# Patient Record
Sex: Female | Born: 1980 | Race: Black or African American | Hispanic: No | Marital: Married | Smoking: Never smoker
Health system: Southern US, Community
[De-identification: ages and names within clinical notes are randomized; demographics above are authoritative.]

## PROBLEM LIST (undated history)

## (undated) DIAGNOSIS — Z98891 History of uterine scar from previous surgery: Secondary | ICD-10-CM

## (undated) DIAGNOSIS — E119 Type 2 diabetes mellitus without complications: Secondary | ICD-10-CM

## (undated) HISTORY — PX: TUBAL LIGATION: SHX77

---
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
## 2002-02-27 ENCOUNTER — Other Ambulatory Visit: Admission: RE | Admit: 2002-02-27 | Discharge: 2002-02-27 | Payer: Self-pay | Admitting: Obstetrics and Gynecology

## 2002-07-15 ENCOUNTER — Inpatient Hospital Stay (HOSPITAL_COMMUNITY): Admission: AD | Admit: 2002-07-15 | Discharge: 2002-07-18 | Payer: Self-pay | Admitting: Obstetrics and Gynecology

## 2002-11-03 ENCOUNTER — Inpatient Hospital Stay (HOSPITAL_COMMUNITY): Admission: AD | Admit: 2002-11-03 | Discharge: 2002-11-03 | Payer: Self-pay | Admitting: Obstetrics and Gynecology

## 2002-12-20 ENCOUNTER — Emergency Department (HOSPITAL_COMMUNITY): Admission: AD | Admit: 2002-12-20 | Discharge: 2002-12-20 | Payer: Self-pay | Admitting: Family Medicine

## 2003-02-02 ENCOUNTER — Inpatient Hospital Stay (HOSPITAL_COMMUNITY): Admission: AD | Admit: 2003-02-02 | Discharge: 2003-02-06 | Payer: Self-pay | Admitting: Obstetrics and Gynecology

## 2003-02-03 ENCOUNTER — Encounter (INDEPENDENT_AMBULATORY_CARE_PROVIDER_SITE_OTHER): Payer: Self-pay | Admitting: *Deleted

## 2004-02-17 ENCOUNTER — Ambulatory Visit (HOSPITAL_BASED_OUTPATIENT_CLINIC_OR_DEPARTMENT_OTHER): Admission: RE | Admit: 2004-02-17 | Discharge: 2004-02-17 | Payer: Self-pay | Admitting: Orthopedic Surgery

## 2004-08-01 ENCOUNTER — Other Ambulatory Visit: Admission: RE | Admit: 2004-08-01 | Discharge: 2004-08-01 | Payer: Self-pay | Admitting: Obstetrics and Gynecology

## 2004-08-29 ENCOUNTER — Emergency Department (HOSPITAL_COMMUNITY): Admission: EM | Admit: 2004-08-29 | Discharge: 2004-08-29 | Payer: Self-pay | Admitting: Emergency Medicine

## 2005-01-20 ENCOUNTER — Inpatient Hospital Stay (HOSPITAL_COMMUNITY): Admission: AD | Admit: 2005-01-20 | Discharge: 2005-01-20 | Payer: Self-pay | Admitting: Obstetrics & Gynecology

## 2005-03-15 ENCOUNTER — Other Ambulatory Visit: Admission: RE | Admit: 2005-03-15 | Discharge: 2005-03-15 | Payer: Self-pay | Admitting: Obstetrics and Gynecology

## 2005-04-23 ENCOUNTER — Ambulatory Visit (HOSPITAL_COMMUNITY): Admission: RE | Admit: 2005-04-23 | Discharge: 2005-04-23 | Payer: Self-pay | Admitting: Obstetrics and Gynecology

## 2005-04-23 ENCOUNTER — Encounter (INDEPENDENT_AMBULATORY_CARE_PROVIDER_SITE_OTHER): Payer: Self-pay | Admitting: *Deleted

## 2006-10-21 ENCOUNTER — Ambulatory Visit (HOSPITAL_COMMUNITY): Admission: RE | Admit: 2006-10-21 | Discharge: 2006-10-21 | Payer: Self-pay | Admitting: Obstetrics and Gynecology

## 2008-09-14 ENCOUNTER — Inpatient Hospital Stay (HOSPITAL_COMMUNITY): Admission: AD | Admit: 2008-09-14 | Discharge: 2008-09-14 | Payer: Self-pay | Admitting: Obstetrics and Gynecology

## 2008-09-16 ENCOUNTER — Inpatient Hospital Stay (HOSPITAL_COMMUNITY): Admission: AD | Admit: 2008-09-16 | Discharge: 2008-09-16 | Payer: Self-pay | Admitting: Obstetrics and Gynecology

## 2009-08-09 ENCOUNTER — Ambulatory Visit: Payer: Self-pay | Admitting: Physician Assistant

## 2009-08-09 ENCOUNTER — Inpatient Hospital Stay (HOSPITAL_COMMUNITY): Admission: AD | Admit: 2009-08-09 | Discharge: 2009-08-10 | Payer: Self-pay | Admitting: Obstetrics and Gynecology

## 2009-08-15 ENCOUNTER — Inpatient Hospital Stay (HOSPITAL_COMMUNITY): Admission: AD | Admit: 2009-08-15 | Discharge: 2009-08-15 | Payer: Self-pay | Admitting: Obstetrics and Gynecology

## 2009-08-15 ENCOUNTER — Ambulatory Visit: Payer: Self-pay | Admitting: Obstetrics and Gynecology

## 2009-08-15 DIAGNOSIS — R109 Unspecified abdominal pain: Secondary | ICD-10-CM

## 2009-08-15 DIAGNOSIS — O00109 Unspecified tubal pregnancy without intrauterine pregnancy: Secondary | ICD-10-CM

## 2009-08-18 ENCOUNTER — Ambulatory Visit (HOSPITAL_COMMUNITY): Admission: RE | Admit: 2009-08-18 | Discharge: 2009-08-18 | Payer: Self-pay | Admitting: Obstetrics and Gynecology

## 2009-08-18 DIAGNOSIS — O00109 Unspecified tubal pregnancy without intrauterine pregnancy: Secondary | ICD-10-CM

## 2009-08-19 ENCOUNTER — Ambulatory Visit: Payer: Self-pay | Admitting: Nurse Practitioner

## 2009-08-19 ENCOUNTER — Inpatient Hospital Stay (HOSPITAL_COMMUNITY): Admission: AD | Admit: 2009-08-19 | Discharge: 2009-08-19 | Payer: Self-pay | Admitting: Obstetrics and Gynecology

## 2009-08-21 ENCOUNTER — Ambulatory Visit (HOSPITAL_COMMUNITY): Admission: RE | Admit: 2009-08-21 | Discharge: 2009-08-21 | Payer: Self-pay | Admitting: Obstetrics and Gynecology

## 2009-08-28 ENCOUNTER — Ambulatory Visit: Payer: Self-pay | Admitting: Nurse Practitioner

## 2009-08-28 ENCOUNTER — Ambulatory Visit (HOSPITAL_COMMUNITY): Admission: RE | Admit: 2009-08-28 | Discharge: 2009-08-28 | Payer: Self-pay | Admitting: Obstetrics & Gynecology

## 2009-11-16 ENCOUNTER — Ambulatory Visit (HOSPITAL_COMMUNITY): Admission: RE | Admit: 2009-11-16 | Discharge: 2009-11-16 | Payer: Self-pay | Admitting: Obstetrics and Gynecology

## 2009-12-06 ENCOUNTER — Encounter
Admission: RE | Admit: 2009-12-06 | Discharge: 2009-12-06 | Payer: Self-pay | Source: Home / Self Care | Attending: Obstetrics and Gynecology | Admitting: Obstetrics and Gynecology

## 2009-12-25 ENCOUNTER — Ambulatory Visit (HOSPITAL_COMMUNITY)
Admission: RE | Admit: 2009-12-25 | Discharge: 2009-12-25 | Payer: Self-pay | Source: Home / Self Care | Admitting: Obstetrics and Gynecology

## 2010-01-24 ENCOUNTER — Ambulatory Visit (HOSPITAL_COMMUNITY)
Admission: RE | Admit: 2010-01-24 | Discharge: 2010-01-24 | Payer: Self-pay | Source: Home / Self Care | Attending: Obstetrics and Gynecology | Admitting: Obstetrics and Gynecology

## 2010-01-30 ENCOUNTER — Inpatient Hospital Stay (HOSPITAL_COMMUNITY)
Admission: AD | Admit: 2010-01-30 | Discharge: 2010-01-30 | Payer: Self-pay | Source: Home / Self Care | Attending: Obstetrics and Gynecology | Admitting: Obstetrics and Gynecology

## 2010-02-05 ENCOUNTER — Ambulatory Visit (HOSPITAL_COMMUNITY)
Admission: RE | Admit: 2010-02-05 | Discharge: 2010-02-05 | Payer: Self-pay | Source: Home / Self Care | Attending: Obstetrics and Gynecology | Admitting: Obstetrics and Gynecology

## 2010-02-06 ENCOUNTER — Ambulatory Visit (HOSPITAL_COMMUNITY)
Admission: RE | Admit: 2010-02-06 | Discharge: 2010-02-06 | Payer: Self-pay | Source: Home / Self Care | Attending: Obstetrics and Gynecology | Admitting: Obstetrics and Gynecology

## 2010-02-13 ENCOUNTER — Inpatient Hospital Stay (HOSPITAL_COMMUNITY)
Admission: AD | Admit: 2010-02-13 | Discharge: 2010-02-13 | Payer: Self-pay | Source: Home / Self Care | Attending: Obstetrics and Gynecology | Admitting: Obstetrics and Gynecology

## 2010-02-14 LAB — CBC
HCT: 34.3 % — ABNORMAL LOW (ref 36.0–46.0)
Hemoglobin: 11.1 g/dL — ABNORMAL LOW (ref 12.0–15.0)
MCH: 27.1 pg (ref 26.0–34.0)
MCHC: 32.4 g/dL (ref 30.0–36.0)
MCV: 83.9 fL (ref 78.0–100.0)
Platelets: 279 10*3/uL (ref 150–400)
RBC: 4.09 MIL/uL (ref 3.87–5.11)
RDW: 14.7 % (ref 11.5–15.5)
WBC: 12.7 10*3/uL — ABNORMAL HIGH (ref 4.0–10.5)

## 2010-02-14 LAB — URIC ACID: Uric Acid, Serum: 3.1 mg/dL (ref 2.4–7.0)

## 2010-02-14 LAB — COMPREHENSIVE METABOLIC PANEL
ALT: 27 U/L (ref 0–35)
AST: 23 U/L (ref 0–37)
Albumin: 2.9 g/dL — ABNORMAL LOW (ref 3.5–5.2)
Alkaline Phosphatase: 91 U/L (ref 39–117)
BUN: 11 mg/dL (ref 6–23)
CO2: 25 mEq/L (ref 19–32)
Calcium: 8.8 mg/dL (ref 8.4–10.5)
Chloride: 104 mEq/L (ref 96–112)
Creatinine, Ser: 0.59 mg/dL (ref 0.4–1.2)
GFR calc Af Amer: >60 mL/min (ref >60)
GFR calc non Af Amer: >60 mL/min (ref >60)
Glucose, Bld: 171 mg/dL — ABNORMAL HIGH (ref 70–99)
Potassium: 3.7 mEq/L (ref 3.5–5.1)
Sodium: 135 mEq/L (ref 135–145)
Total Bilirubin: 0.5 mg/dL (ref 0.3–1.2)
Total Protein: 7 g/dL (ref 6.0–8.3)

## 2010-02-14 LAB — URINALYSIS, ROUTINE W REFLEX MICROSCOPIC
Bilirubin Urine: NEGATIVE
Hgb urine dipstick: NEGATIVE
Ketones, ur: 15 mg/dL — AB
Leukocytes, UA: NEGATIVE
Nitrite: NEGATIVE
Protein, ur: NEGATIVE mg/dL
Specific Gravity, Urine: >1.03 — ABNORMAL HIGH (ref 1.005–1.030)
Urine Glucose, Fasting: >1000 mg/dL — AB
Urobilinogen, UA: 0.2 mg/dL (ref 0.0–1.0)
pH: 6 (ref 5.0–8.0)

## 2010-02-14 LAB — URINE MICROSCOPIC-ADD ON

## 2010-02-14 LAB — LACTATE DEHYDROGENASE: LDH: 155 U/L (ref 94–250)

## 2010-02-19 ENCOUNTER — Ambulatory Visit (HOSPITAL_COMMUNITY)
Admission: RE | Admit: 2010-02-19 | Discharge: 2010-02-19 | Payer: Self-pay | Source: Home / Self Care | Attending: Obstetrics and Gynecology | Admitting: Obstetrics and Gynecology

## 2010-04-09 LAB — CBC
HCT: 34.7 % — ABNORMAL LOW (ref 36.0–46.0)
Hemoglobin: 11.3 g/dL — ABNORMAL LOW (ref 12.0–15.0)
MCH: 27.2 pg (ref 26.0–34.0)
MCHC: 32.6 g/dL (ref 30.0–36.0)
MCV: 83.4 fL (ref 78.0–100.0)
Platelets: 280 10*3/uL (ref 150–400)
RBC: 4.16 MIL/uL (ref 3.87–5.11)
RDW: 14.9 % (ref 11.5–15.5)
WBC: 11.4 10*3/uL — ABNORMAL HIGH (ref 4.0–10.5)

## 2010-04-09 LAB — COMPREHENSIVE METABOLIC PANEL
ALT: 27 U/L (ref 0–35)
AST: 20 U/L (ref 0–37)
Albumin: 3 g/dL — ABNORMAL LOW (ref 3.5–5.2)
Alkaline Phosphatase: 85 U/L (ref 39–117)
BUN: 9 mg/dL (ref 6–23)
CO2: 25 mEq/L (ref 19–32)
Calcium: 8.4 mg/dL (ref 8.4–10.5)
Chloride: 101 mEq/L (ref 96–112)
Creatinine, Ser: 0.56 mg/dL (ref 0.4–1.2)
GFR calc Af Amer: >60 mL/min (ref >60)
GFR calc non Af Amer: >60 mL/min (ref >60)
Glucose, Bld: 170 mg/dL — ABNORMAL HIGH (ref 70–99)
Potassium: 3.7 mEq/L (ref 3.5–5.1)
Sodium: 132 mEq/L — ABNORMAL LOW (ref 135–145)
Total Bilirubin: 0.5 mg/dL (ref 0.3–1.2)
Total Protein: 7.1 g/dL (ref 6.0–8.3)

## 2010-04-12 ENCOUNTER — Inpatient Hospital Stay (HOSPITAL_COMMUNITY)
Admission: AD | Admit: 2010-04-12 | Discharge: 2010-04-13 | Disposition: A | Discharge status: Home / Self Care | Payer: 59 | Source: Ambulatory Visit | Attending: Obstetrics and Gynecology | Admitting: Obstetrics and Gynecology

## 2010-04-12 DIAGNOSIS — O139 Gestational [pregnancy-induced] hypertension without significant proteinuria, unspecified trimester: Secondary | ICD-10-CM

## 2010-04-12 DIAGNOSIS — R109 Unspecified abdominal pain: Secondary | ICD-10-CM | POA: Insufficient documentation

## 2010-04-12 LAB — URINE MICROSCOPIC-ADD ON

## 2010-04-12 LAB — URINALYSIS, ROUTINE W REFLEX MICROSCOPIC
Bilirubin Urine: NEGATIVE
Glucose, UA: 100 mg/dL — AB
Ketones, ur: NEGATIVE mg/dL
Leukocytes, UA: NEGATIVE
Nitrite: NEGATIVE
Protein, ur: 30 mg/dL — AB
Specific Gravity, Urine: >1.03 — ABNORMAL HIGH (ref 1.005–1.030)
Urobilinogen, UA: 0.2 mg/dL (ref 0.0–1.0)
pH: 5.5 (ref 5.0–8.0)

## 2010-04-12 LAB — CBC
HCT: 31.5 % — ABNORMAL LOW (ref 36.0–46.0)
Hemoglobin: 9.9 g/dL — ABNORMAL LOW (ref 12.0–15.0)
MCH: 25.8 pg — ABNORMAL LOW (ref 26.0–34.0)
MCHC: 31.4 g/dL (ref 30.0–36.0)
MCV: 82.2 fL (ref 78.0–100.0)
Platelets: 325 10*3/uL (ref 150–400)
RBC: 3.83 MIL/uL — ABNORMAL LOW (ref 3.87–5.11)
RDW: 14.5 % (ref 11.5–15.5)
WBC: 12.9 10*3/uL — ABNORMAL HIGH (ref 4.0–10.5)

## 2010-04-12 LAB — LACTATE DEHYDROGENASE: LDH: 170 U/L (ref 94–250)

## 2010-04-12 LAB — COMPREHENSIVE METABOLIC PANEL
ALT: 22 U/L (ref 0–35)
Alkaline Phosphatase: 108 U/L (ref 39–117)
BUN: 9 mg/dL (ref 6–23)
CO2: 24 mEq/L (ref 19–32)
GFR calc non Af Amer: >60 mL/min (ref >60)
Glucose, Bld: 218 mg/dL — ABNORMAL HIGH (ref 70–99)
Potassium: 3.8 mEq/L (ref 3.5–5.1)
Sodium: 133 mEq/L — ABNORMAL LOW (ref 135–145)
Total Protein: 6.8 g/dL (ref 6.0–8.3)

## 2010-04-12 LAB — URIC ACID: Uric Acid, Serum: 2.9 mg/dL (ref 2.4–7.0)

## 2010-04-14 LAB — URINE MICROSCOPIC-ADD ON

## 2010-04-14 LAB — CBC
Hemoglobin: 11.8 g/dL — ABNORMAL LOW (ref 12.0–15.0)
MCH: 28.3 pg (ref 26.0–34.0)
MCHC: 33.5 g/dL (ref 30.0–36.0)
MCV: 84.8 fL (ref 78.0–100.0)
Platelets: 322 10*3/uL (ref 150–400)
RBC: 4.17 MIL/uL (ref 3.87–5.11)
RDW: 14.8 % (ref 11.5–15.5)
RDW: 15.1 % (ref 11.5–15.5)
WBC: 8.4 10*3/uL (ref 4.0–10.5)

## 2010-04-14 LAB — CREATININE, SERUM
Creatinine, Ser: 0.76 mg/dL (ref 0.4–1.2)
GFR calc Af Amer: >60 mL/min (ref >60)
GFR calc non Af Amer: >60 mL/min (ref >60)

## 2010-04-14 LAB — AST: AST: 23 U/L (ref 0–37)

## 2010-04-14 LAB — HCG, QUANTITATIVE, PREGNANCY
hCG, Beta Chain, Quant, S: 105 m[IU]/mL — ABNORMAL HIGH (ref <5)
hCG, Beta Chain, Quant, S: 126 m[IU]/mL — ABNORMAL HIGH (ref <5)
hCG, Beta Chain, Quant, S: 84 m[IU]/mL — ABNORMAL HIGH (ref <5)

## 2010-04-14 LAB — DIFFERENTIAL
Basophils Relative: 0 % (ref 0–1)
Eosinophils Absolute: 0.1 10*3/uL (ref 0.0–0.7)
Eosinophils Relative: 1 % (ref 0–5)
Lymphs Abs: 2 10*3/uL (ref 0.7–4.0)

## 2010-04-14 LAB — BUN: BUN: 13 mg/dL (ref 6–23)

## 2010-04-14 LAB — URINALYSIS, ROUTINE W REFLEX MICROSCOPIC
Bilirubin Urine: NEGATIVE
Glucose, UA: NEGATIVE mg/dL
Ketones, ur: NEGATIVE mg/dL
Protein, ur: NEGATIVE mg/dL
Urobilinogen, UA: 1 mg/dL (ref 0.0–1.0)

## 2010-04-15 LAB — WET PREP, GENITAL
Trich, Wet Prep: NONE SEEN
Yeast Wet Prep HPF POC: NONE SEEN

## 2010-04-15 LAB — CBC
Platelets: 306 10*3/uL (ref 150–400)
RBC: 4.1 MIL/uL (ref 3.87–5.11)
WBC: 7.4 10*3/uL (ref 4.0–10.5)

## 2010-04-15 LAB — ABO/RH: ABO/RH(D): A POS

## 2010-04-15 LAB — URINALYSIS, ROUTINE W REFLEX MICROSCOPIC
Bilirubin Urine: NEGATIVE
Glucose, UA: NEGATIVE mg/dL
Ketones, ur: NEGATIVE mg/dL
Nitrite: NEGATIVE
pH: 6 (ref 5.0–8.0)

## 2010-04-15 LAB — URINE MICROSCOPIC-ADD ON

## 2010-04-15 LAB — HCG, QUANTITATIVE, PREGNANCY: hCG, Beta Chain, Quant, S: 137 m[IU]/mL — ABNORMAL HIGH (ref <5)

## 2010-04-16 ENCOUNTER — Encounter: Payer: 59 | Attending: Obstetrics and Gynecology | Admitting: Dietician

## 2010-04-16 DIAGNOSIS — Z713 Dietary counseling and surveillance: Secondary | ICD-10-CM | POA: Insufficient documentation

## 2010-04-16 DIAGNOSIS — O99810 Abnormal glucose complicating pregnancy: Secondary | ICD-10-CM | POA: Insufficient documentation

## 2010-04-18 ENCOUNTER — Inpatient Hospital Stay (HOSPITAL_COMMUNITY)
Admission: AD | Admit: 2010-04-18 | Discharge: 2010-04-18 | Disposition: A | Discharge status: Home / Self Care | Payer: 59 | Source: Ambulatory Visit | Attending: Obstetrics & Gynecology | Admitting: Obstetrics & Gynecology

## 2010-04-18 DIAGNOSIS — R109 Unspecified abdominal pain: Secondary | ICD-10-CM | POA: Insufficient documentation

## 2010-04-18 DIAGNOSIS — M545 Low back pain, unspecified: Secondary | ICD-10-CM | POA: Insufficient documentation

## 2010-04-18 DIAGNOSIS — O99891 Other specified diseases and conditions complicating pregnancy: Secondary | ICD-10-CM | POA: Insufficient documentation

## 2010-04-18 LAB — URINALYSIS, ROUTINE W REFLEX MICROSCOPIC
Glucose, UA: 500 mg/dL — AB
Hgb urine dipstick: NEGATIVE
Ketones, ur: 15 mg/dL — AB
Protein, ur: 30 mg/dL — AB

## 2010-04-18 LAB — URINE MICROSCOPIC-ADD ON

## 2010-04-20 ENCOUNTER — Ambulatory Visit: Payer: Self-pay | Admitting: Dietician

## 2010-05-05 LAB — WET PREP, GENITAL
Trich, Wet Prep: NONE SEEN
Yeast Wet Prep HPF POC: NONE SEEN

## 2010-05-05 LAB — URINALYSIS, DIPSTICK ONLY
Bilirubin Urine: NEGATIVE
Glucose, UA: NEGATIVE mg/dL
Ketones, ur: 15 mg/dL — AB
Leukocytes, UA: NEGATIVE
pH: 6 (ref 5.0–8.0)

## 2010-05-05 LAB — CBC
Platelets: 294 10*3/uL (ref 150–400)
WBC: 7.3 10*3/uL (ref 4.0–10.5)

## 2010-05-05 LAB — GC/CHLAMYDIA PROBE AMP, GENITAL: GC Probe Amp, Genital: NEGATIVE

## 2010-05-16 ENCOUNTER — Other Ambulatory Visit (HOSPITAL_COMMUNITY): Payer: Self-pay | Admitting: Obstetrics and Gynecology

## 2010-05-16 DIAGNOSIS — O2690 Pregnancy related conditions, unspecified, unspecified trimester: Secondary | ICD-10-CM

## 2010-05-18 ENCOUNTER — Ambulatory Visit (HOSPITAL_COMMUNITY)
Admission: RE | Admit: 2010-05-18 | Discharge: 2010-05-18 | Disposition: A | Discharge status: Home / Self Care | Payer: 59 | Source: Ambulatory Visit | Attending: Obstetrics and Gynecology | Admitting: Obstetrics and Gynecology

## 2010-05-18 DIAGNOSIS — O2690 Pregnancy related conditions, unspecified, unspecified trimester: Secondary | ICD-10-CM

## 2010-05-18 DIAGNOSIS — O10019 Pre-existing essential hypertension complicating pregnancy, unspecified trimester: Secondary | ICD-10-CM | POA: Insufficient documentation

## 2010-05-18 DIAGNOSIS — O34219 Maternal care for unspecified type scar from previous cesarean delivery: Secondary | ICD-10-CM | POA: Insufficient documentation

## 2010-05-18 DIAGNOSIS — O99810 Abnormal glucose complicating pregnancy: Secondary | ICD-10-CM | POA: Insufficient documentation

## 2010-05-19 ENCOUNTER — Inpatient Hospital Stay (HOSPITAL_COMMUNITY)
Admission: AD | Admit: 2010-05-19 | Discharge: 2010-05-20 | Disposition: A | Discharge status: Home / Self Care | Payer: 59 | Source: Ambulatory Visit | Attending: Obstetrics and Gynecology | Admitting: Obstetrics and Gynecology

## 2010-05-19 DIAGNOSIS — O10019 Pre-existing essential hypertension complicating pregnancy, unspecified trimester: Secondary | ICD-10-CM | POA: Insufficient documentation

## 2010-05-20 LAB — COMPREHENSIVE METABOLIC PANEL
ALT: 22 U/L (ref 0–35)
AST: 20 U/L (ref 0–37)
Alkaline Phosphatase: 123 U/L — ABNORMAL HIGH (ref 39–117)
CO2: 26 mEq/L (ref 19–32)
Chloride: 101 mEq/L (ref 96–112)
Creatinine, Ser: 0.71 mg/dL (ref 0.4–1.2)
GFR calc Af Amer: >60 mL/min (ref >60)
GFR calc non Af Amer: >60 mL/min (ref >60)
Potassium: 3.9 mEq/L (ref 3.5–5.1)
Total Bilirubin: 0.4 mg/dL (ref 0.3–1.2)

## 2010-05-20 LAB — CBC
Hemoglobin: 9.7 g/dL — ABNORMAL LOW (ref 12.0–15.0)
MCH: 24.4 pg — ABNORMAL LOW (ref 26.0–34.0)
RBC: 3.98 MIL/uL (ref 3.87–5.11)

## 2010-05-31 ENCOUNTER — Encounter (HOSPITAL_COMMUNITY): Payer: Self-pay | Admitting: *Deleted

## 2010-05-31 ENCOUNTER — Inpatient Hospital Stay (HOSPITAL_COMMUNITY)
Admission: RE | Admit: 2010-05-31 | Discharge: 2010-06-05 | DRG: 765 | Disposition: A | Discharge status: Home / Self Care | Payer: 59 | Source: Ambulatory Visit | Attending: Obstetrics and Gynecology | Admitting: Obstetrics and Gynecology

## 2010-05-31 DIAGNOSIS — Z302 Encounter for sterilization: Secondary | ICD-10-CM

## 2010-05-31 DIAGNOSIS — IMO0002 Reserved for concepts with insufficient information to code with codable children: Secondary | ICD-10-CM | POA: Diagnosis present

## 2010-05-31 DIAGNOSIS — O99814 Abnormal glucose complicating childbirth: Principal | ICD-10-CM | POA: Diagnosis present

## 2010-05-31 DIAGNOSIS — O3660X0 Maternal care for excessive fetal growth, unspecified trimester, not applicable or unspecified: Secondary | ICD-10-CM | POA: Diagnosis present

## 2010-05-31 DIAGNOSIS — I10 Essential (primary) hypertension: Secondary | ICD-10-CM | POA: Diagnosis present

## 2010-05-31 LAB — COMPREHENSIVE METABOLIC PANEL
BUN: 10 mg/dL (ref 6–23)
CO2: 24 mEq/L (ref 19–32)
Calcium: 9.1 mg/dL (ref 8.4–10.5)
Creatinine, Ser: 0.72 mg/dL (ref 0.4–1.2)
GFR calc Af Amer: >60 mL/min (ref >60)
GFR calc non Af Amer: >60 mL/min (ref >60)
Glucose, Bld: 197 mg/dL — ABNORMAL HIGH (ref 70–99)
Total Bilirubin: 0.4 mg/dL (ref 0.3–1.2)

## 2010-05-31 LAB — CBC
Platelets: 345 10*3/uL (ref 150–400)
RBC: 3.98 MIL/uL (ref 3.87–5.11)
RDW: 15.3 % (ref 11.5–15.5)
WBC: 8.9 10*3/uL (ref 4.0–10.5)

## 2010-05-31 LAB — URIC ACID: Uric Acid, Serum: 4.6 mg/dL (ref 2.4–7.0)

## 2010-05-31 LAB — LACTATE DEHYDROGENASE: LDH: 270 U/L — ABNORMAL HIGH (ref 94–250)

## 2010-06-01 ENCOUNTER — Other Ambulatory Visit: Payer: Self-pay | Admitting: Obstetrics and Gynecology

## 2010-06-01 LAB — GLUCOSE, CAPILLARY
Glucose-Capillary: 211 mg/dL — ABNORMAL HIGH (ref 70–99)
Glucose-Capillary: 228 mg/dL — ABNORMAL HIGH (ref 70–99)
Glucose-Capillary: 146 mg/dL — ABNORMAL HIGH (ref 70–99)
Glucose-Capillary: 153 mg/dL — ABNORMAL HIGH (ref 70–99)
Glucose-Capillary: 144 mg/dL — ABNORMAL HIGH (ref 70–99)

## 2010-06-01 LAB — RPR: RPR Ser Ql: NONREACTIVE

## 2010-06-01 LAB — SURGICAL PCR SCREEN: MRSA, PCR: NEGATIVE

## 2010-06-02 LAB — CBC
HCT: 31 % — ABNORMAL LOW (ref 36.0–46.0)
Hemoglobin: 9.5 g/dL — ABNORMAL LOW (ref 12.0–15.0)
MCH: 24 pg — ABNORMAL LOW (ref 26.0–34.0)
MCHC: 30.6 g/dL (ref 30.0–36.0)

## 2010-06-02 LAB — GLUCOSE, CAPILLARY
Glucose-Capillary: 75 mg/dL (ref 70–99)
Glucose-Capillary: 108 mg/dL — ABNORMAL HIGH (ref 70–99)
Glucose-Capillary: 102 mg/dL — ABNORMAL HIGH (ref 70–99)

## 2010-06-03 LAB — GLUCOSE, CAPILLARY
Glucose-Capillary: 158 mg/dL — ABNORMAL HIGH (ref 70–99)
Glucose-Capillary: 75 mg/dL (ref 70–99)
Glucose-Capillary: 54 mg/dL — ABNORMAL LOW (ref 70–99)
Glucose-Capillary: 65 mg/dL — ABNORMAL LOW (ref 70–99)
Glucose-Capillary: 66 mg/dL — ABNORMAL LOW (ref 70–99)
Glucose-Capillary: 66 mg/dL — ABNORMAL LOW (ref 70–99)
Glucose-Capillary: 172 mg/dL — ABNORMAL HIGH (ref 70–99)

## 2010-06-03 LAB — CREATININE, SERUM: GFR calc non Af Amer: >60 mL/min (ref >60)

## 2010-06-04 LAB — GLUCOSE, CAPILLARY
Glucose-Capillary: 111 mg/dL — ABNORMAL HIGH (ref 70–99)
Glucose-Capillary: 79 mg/dL (ref 70–99)
Glucose-Capillary: 125 mg/dL — ABNORMAL HIGH (ref 70–99)

## 2010-06-05 LAB — GLUCOSE, CAPILLARY
Glucose-Capillary: 76 mg/dL (ref 70–99)
Glucose-Capillary: 103 mg/dL — ABNORMAL HIGH (ref 70–99)
Glucose-Capillary: 118 mg/dL — ABNORMAL HIGH (ref 70–99)
Glucose-Capillary: 109 mg/dL — ABNORMAL HIGH (ref 70–99)

## 2010-06-13 NOTE — Op Note (Signed)
Nicole Cherry, Nicole Cherry               ACCOUNT NO.:  [id number]  MEDICAL RECORD NO.:  [id number]           PATIENT TYPE:  I  LOCATION:  9374                          FACILITY:  [facility]  PHYSICIAN:  Carrington Clamp, M.D. DATE OF BIRTH:  Apr 15, 1980  DATE OF PROCEDURE:  06/01/2010 DATE OF DISCHARGE:                              OPERATIVE REPORT   PREOPERATIVE DIAGNOSES: 1. A 35-week intrauterine pregnancy. 2. Uncontrolled A2 gestational diabetes. 3. Chronic hypertension. 4. Superimposed severe preeclampsia. 5. Multiparous, desires permit sterility.  POSTOPERATIVE DIAGNOSIS: 1. A 35-week intrauterine pregnancy. 2. Uncontrolled A2 gestational diabetes. 3. Chronic hypertension. 4. Superimposed severe preeclampsia. 5. Multiparous, desires permit sterility. 6. Macrosomia.  PROCEDURE:  Repeat low-transverse cesarean section and bilateral tubal ligation.  SURGEON:  Carrington Clamp, MD  ASSISTANT:  Luvenia Redden, MD  ANESTHESIA:  Spinal.  FINDINGS:  Female infant, vertex presentation, 8 pounds 12 ounces, Apgar's of 8 and 9.  Normal tubes, ovaries, and uterus were seen.  SPECIMEN:  Placenta to pathology as well as right and left tubal segments to pathology.  ESTIMATED BLOOD LOSS:  1000 mL.  IV FLUIDS:  1125 mL.  Urine output was 150 mL.  COMPLICATIONS:  None.  MEDICATIONS:  Ancef, Pitocin, and Glucommander with insulin.  COUNTS:  Correct x3.  REASON FOR OPERATION:  Nicole Cherry is a 30year-old G7, P1-0-5-1 who has had a complicated prenatal course including uncontrolled gestational diabetes despite our best efforts and chronic hypertension. Recently her blood pressures have been spiking to 170s/100s, although they would come down afterwards with watching them after bedrest.  The patient's labetalol had recently been increased a t.i.d.  She had recently had a 24-hour urine that showed 312 mg of protein.  She recently had an ultrasound that showed the baby was vertex  presentation, weighing approximately 8 pounds.  The patient had been seen by [facility] and I called Dr. Tona Sensing for a phone consult on May 31, 2010.  I discussed with him the proteinuria, headache the fact that the patient was having intermittent headaches and also the worsening blood pressures and Dr. Tona Sensing felt strongly that this consisted of severe superimposed preeclampsia and recommended an expedited delivery.  The patient was then brought in for glucose control overnight on May 31, 2010.  Her fasting blood glucose that morning was 101 followed by another one at 2 hours later of 115.  The patient had been receiving an insulin drip with the Glucommander throughout the night.  The neonatal team was informed of her status this morning.  All risks, benefits, and alternatives have been discussed with the patient and the patient agreed to proceed.  The patient was scheduled for a C- section at 8 o'clock this morning.  TECHNIQUE:  The patient was taken to the operating room this morning and after adequate spinal anesthesia was achieved, the patient was prepped and draped in usual sterile fashion in dorsal supine position with leftward tilt.  A Pfannenstiel skin incision was made with the scalpel carried down to the fascia with Bovie cautery.  The fascia was incised in midline with the  scalpel and carried in transverse curvilinear manner with Mayo scissors.  Hemostasis was achieved with Bovie cautery careful to try and cauterize as many cut vessels as possible.  The fascia was reflected superior inferior from rectus muscles with both sharp and blunt dissection with the Mayo scissors and the Bovie cautery.  A bowel free portion of the peritoneum was then carefully entered into with the scalpel.  The Mayo scissors were then used to carefully separate the rectus muscles and extend the incision.  The peritoneum was extended with good visualization of bowel and the  bladder.  The Alexis instrument was then placed and vesicouterine fascia tented up and incised in a transverse curvilinear manner.  The bladder was retracted inferiorly and the 2 cm incision was made in the upper portion of the lower uterine segment until the amnion was opened.  Clear fluid was noted.  The baby was delivered with the vacuum extractor without complication.  There were no pop offs.  The baby was bulb suctioned. The cord was clamped and cut and baby was handed to awaiting neonatal team.  The placenta was then delivered manually after cord bloods had been obtained.  The uterus was cleared of all debris.  The uterine incision was then closed with a running lock stitch of 0 Monocryl.  An imbricating layer of 0 Monocryl was also used.  Four additional figure- of-eight stitches were then used to help achieve hemostasis.  Once hemostasis was achieved, attention was turned to the tubes.  Avascular portion of mesosalpinx was entered into on the left-hand side and extended for segment of about 4 cm.  Two 0 Vicryl freehand ties were then tied to create intervening segment of approximately 4 cm of the tube.  The tube was then excised and sent to pathology.  On the right- hand side, the same procedure was performed, however, an avascular portion was able to gotten for the entire 4 cm, so a hemostat was used to cauterize the mesosalpinx vessel and this was achieved without complication.  There was no bleeding.  The segment on the right-hand side was then secured with 2 freehand ties on each side of 0 plain gut. The tube was then excised with the Metzenbaum scissors and sent to pathology.  The mesosalpinx was inspected and found to be hemostatic. The tubes were then returned to the abdomen and the uterine incision reinspected and found to be hemostatic.  The Alexis instrument was then removed and the peritoneum closed with a running stitch of 2-0 Vicryl. This incorporated the rectus  muscles as separate layer.  The fascia was then closed with a running stitch of 0 Vicryl.  The subcutaneous tissues rendered hemostatic with Bovie cautery irrigation.  The subcutaneous tissue was then closed with interrupted stitches of 2-0 plain gut.  The skin was closed with staples.  The patient tolerated the procedure well and was returned to recovery in stable condition.     Carrington Clamp, M.D.     MH/MEDQ  D:  06/01/2010  T:  06/01/2010  Job:  [id number]  Electronically Signed by Carrington Clamp MD on 06/13/2010 03:02:33 PM

## 2010-06-15 NOTE — Discharge Summary (Signed)
   NAMEBRECK, HOLLINGER                           ACCOUNT NO.:  [id number]   MEDICAL RECORD NO.:  [id number]                   PATIENT TYPE:  INP   LOCATION:  9307                                 FACILITY:  [facility]   PHYSICIAN:  Malva Limes, M.D.                 DATE OF BIRTH:  1980/04/19   DATE OF ADMISSION:  07/15/2002  DATE OF DISCHARGE:  07/18/2002                                 DISCHARGE SUMMARY   FINAL DIAGNOSES:  1. Intrauterine pregnancy at six weeks gestation.  2. Pyelonephritis.   HOSPITAL COURSE:  This 30 year old G2 P0 presents at six weeks gestation  with right CVA tenderness and a temperature.  The patient was also having  some nausea and vomiting.  She had a temperature of 101.1 upon admission.  She was admitted at this time and her urinalysis showed 20-30 white blood  cell count per field.  She was admitted at this time for IV antibiotics.  She had had an ultrasound performed two days ago which did confirm an  intrauterine pregnancy and gave her dates.  By hospital day #1 the patient  was feeling much better and the maximum of her temperature was 100.4.  The  patient was rechecked again 24 hours later.  She continued to be afebrile  and her pain was gone.  She was felt ready for discharge.  She was sent home  on a regular diet, told to continue her antibiotic with Keflex 500 mg one  q.i.d. for five more days.  She was to follow back up in the office in a  week, and to call if any more fevers or pain.     [name], P.A.-C.                Malva Limes, M.D.    MB/MEDQ  D:  08/30/2002  T:  08/31/2002  Job:  [id number]

## 2010-06-15 NOTE — Op Note (Signed)
Nicole Cherry, Nicole Cherry               ACCOUNT NO.:  [id number]   MEDICAL RECORD NO.:  [id number]          PATIENT TYPE:  AMB   LOCATION:  SDC                           [facility]   PHYSICIAN:  Carrington Clamp, M.D. DATE OF BIRTH:  08/04/1980   DATE OF PROCEDURE:  04/23/2005  DATE OF DISCHARGE:                                 OPERATIVE REPORT   PREOPERATIVE DIAGNOSIS:  Missed AB.   POSTOPERATIVE DIAGNOSIS:  Missed AB.   PROCEDURES:  Dilation and evacuation, less than 12 weeks.   SURGEON:  Carrington Clamp, M.D.   ASSISTANT:  None.   ANESTHESIA:  General LMA   SPECIMENS:  Uterine contents.   ESTIMATED BLOOD LOSS:  200 mL.   IV FLUIDS:  500 mL.   URINE OUTPUT:  Not measured.   COMPLICATIONS:  Were none.   FINDINGS:  8 weeks' size uterus down to 7-week size post procedure. Good  CRIE.  Counts were correct x3.  Medications were Methergine 0.2 mg IM.   TECHNIQUE:  After adequate LMA anesthesia was achieved, the patient prepped,  draped in usual sterile fashion in dorsal lithotomy position.  Bladder was  drained with a rubber catheter and speculum placed in the vagina.  The  cervix was grasped with the single-tooth tenaculum and the cervix was  dilated up with Shawnie Pons dilators.  A 9 mm curette was passed into the uterine  cavity and suction curettage was performed.  Alternating suction and sharp  curettage was used until good CRIE was obtained. Methergine 0.2 mg IM was  given. Bleeding was slightly more than usual but not more than anticipated.  All instruments were then withdrawn from the vagina and the patient  tolerated the procedure well.  She returns to recovery room in stable  condition.     Carrington Clamp, M.D.  Electronically Signed    MH/MEDQ  D:  04/23/2005  T:  04/24/2005  Job:  [id number]

## 2010-06-15 NOTE — Discharge Summary (Signed)
Nicole Cherry, DELROSARIO                           ACCOUNT NO.:  [id number]   MEDICAL RECORD NO.:  [id number]                   PATIENT TYPE:  INP   LOCATION:  9120                                 [facility]ACILITY:  [facility]   PHYSICIAN:  Ilda Mori, M.D.                DATE OF BIRTH:  [birth year]   DATE OF ADMISSION:  02/02/2003  DATE OF DISCHARGE:  02/06/2003                                 DISCHARGE SUMMARY   FINAL DIAGNOSES:  1. Intrauterine pregnancy at [redacted] weeks gestation.  2. Preeclampsia.  3. Failed induction.  4. Nonreassuring fetal heart tones.  5. __________ for vaginal delivery.   PROCEDURE:  Primary low transverse cesarean section.   SURGEON:  Carrington Clamp, M.D.   ASSISTANT:  Lesly Dukes, M.D.   COMPLICATIONS:  None.   HISTORY:  This 30 year old, G2, P0-0-1-0, presented at 38 and 2/7ths weeks  gestation with proteinuria, hypertension, and edema.  The patient's blood  pressures were about 150/104 during her office visit that day, with 2+  protein in her urine.  At this point, of course, the patient was admitted to  the hospital.  She had noticed some increase in her edema.  The patient's  antepartum course had been uncomplicated up to this point.  She had not had  group B strep cultures performed up to this point.  The patient was admitted  at this time.   HOSPITAL COURSE:  She was started on Cytotec.  She was given penicillin for  group B strep prophylaxis.  At this point, IV Pitocin was begun.  The  patient had persistent variable decelerations, and at this point, a decision  was made with the patient to proceed with a cesarean section.  The patient  was taken to the operating room on February 03, 2003 by Dr. Carrington Clamp,  where a primary low transverse cesarean section was performed with the  delivery of a 4 pound, 15 ounce female infant with Apgars of 7/9.  Delivery  went without complication.  The patient's postoperative course was benign  without significant  fevers.  The patient continued to have some elevated  blood pressures postoperatively, but they were lowering.  By postoperative  day #3, the patient's blood pressure was in the 140-150/80-90 range.  She  was felt ready for discharge.   DIET:  She was sent home on a regular diet.   ACTIVITY:  She was told to decrease activity.   DISCHARGE MEDICATIONS:  1. She was told to continue prenatal vitamins.  2. She was given Tylox, #25, 1-2 q.4h. as needed for pain.   FOLLOW UP:  She was to follow up in the office in 1 week for a blood  pressure check, and to call with any problems.   LABORATORY DATA ON DISCHARGE:  The patient had a hemoglobin of 8.7, white  blood cell count of 12.1.     [name], P.A.-C.  Ilda Mori, M.D.    MB/MEDQ  D:  03/14/2003  T:  03/14/2003  Job:  [id number]

## 2010-06-15 NOTE — Op Note (Signed)
Nicole Cherry, LEVANDOWSKI                           ACCOUNT NO.:  [id number]   MEDICAL RECORD NO.:  [id number]                   PATIENT TYPE:  INP   LOCATION:  9120                                 [facility]ACILITY:  [facility]   PHYSICIAN:  Carrington Clamp, M.D.              DATE OF BIRTH:  1981-01-25   DATE OF PROCEDURE:  02/03/2003  DATE OF DISCHARGE:                                 OPERATIVE REPORT   PREOPERATIVE DIAGNOSES:  1. Preterm with preeclampsia.  2. Nonreassuring fetal heart tones, remote for a vaginal delivery.   POSTOPERATIVE DIAGNOSES:  1. Preterm with preeclampsia.  2. Nonreassuring fetal heart tones, remote for a vaginal delivery.   PROCEDURE:  Primary low transverse cesarean section.   SURGEON:  Carrington Clamp, M.D.   ASSISTANT:  Lesly Dukes, M.D.   ANESTHESIA:  Epidural.   ESTIMATED BLOOD LOSS:  800 mL.   IV FLUIDS:  2700 mL.   URINE OUTPUT:  125 mL.   COMPLICATIONS:  None.   FINDINGS:  A female infant in vertex presentation.  Weight 4 pounds, 15  ounces.  Baby appeared to be approximately 34 weeks.  Cord pH was 7.33, and  Apgars are 7 and 9.  There are normal tubes, ovaries, and uterus seen.   MEDICATIONS:  Ancef and Pitocin.   PATHOLOGY:  Placenta.   COUNTS:  Correct x 3.   TECHNIQUE:  After adequate epidural anesthesia was achieved, the patient was  prepped and draped in the usual sterile fashion in the dorsal supine  position with a leftward tilt.  A Pfannenstiel incision was made with a  scalpel, carried down to the fascia with the Bovie cautery.  The fascia was  incised in the midline with the scalpel and carried in a transverse  curvilinear manner with the bandage scissors.  The fascia was reflected  superiorly and inferiorly from the rectus muscles, rectus muscles split in  the midline.  The bowel free portion of peritoneum was entered into bluntly  and the peritoneum incised in an superior and inferior manner with the  Metzenbaum scissors with good  visualization of bowel and bladder.   The bladder blade was placed, the vesicouterine fascia tented up and entered  into with the Metzenbaum scissors.  A transverse curvilinear incision was  made, and the bladder flap was created bluntly.  Bladder blade was replaced,  and an approximate 2 cm incision was made in the upper portion of the lower  uterine segment until clear fluid was noted upon entry into the amnion.  The  uterine incision was extended in a transverse curvilinear manner, and the  baby was identified in the vertex presentation and delivered with the vacuum  suction without complication and in one pull.  The baby was bulb suctioned.  Cord was clamped and cut.  Cord gases and blood were obtained and baby was  handed to awaiting pediatrics.  The placenta was then  removed manually and  the uterus exteriorized, wrapped in wet lap, and cleared of all debris.  The  uterine incision was then closed with a running lock suture of 0 Monocryl.  An imbricating layer of 0 Monocryl was used, and hemostasis was achieved.  The uterus was reapproximated in the abdominal cavity and the gutters  cleared of all debris with irrigation.  The uterine incision was reinspected  and found to be hemostatic, and the peritoneum was then closed with a  running suture of 2-0 Vicryl.  The fascia was closed with a running stitch  of 0 Vicryl.  The subcutaneous tissue was brought together with three  interrupted stitches of 2-0 plain gut.  The skin was closed with staples.  The patient tolerated the procedure well, and she was returned to the  recovery room in stable condition.                                               Carrington Clamp, M.D.    MH/MEDQ  D:  02/03/2003  T:  02/03/2003  Job:  [id number]

## 2010-06-15 NOTE — Op Note (Signed)
Nicole Cherry, Nicole Cherry               ACCOUNT NO.:  [id number]   MEDICAL RECORD NO.:  [id number]          PATIENT TYPE:  AMB   LOCATION:  DSC                          FACILITY:  [facility]   PHYSICIAN:  Katy Fitch. Sypher Montez Hageman., M.D.DATE OF BIRTH:  [birth year]   DATE OF PROCEDURE:  02/17/2004  DATE OF DISCHARGE:                                 OPERATIVE REPORT   PREOPERATIVE DIAGNOSIS:  Entrapment neuropathy, median nerve, right carpal  tunnel.   POSTOPERATIVE DIAGNOSIS:  Entrapment neuropathy, median nerve, right carpal  tunnel.   OPERATION:  Release of right tranverse carpal ligament.   OPERATIONS:  Katy Fitch. Sypher, M.D.   ASSISTANT:  Jonni Sanger, P.A.   ANESTHESIA:  General by LMA, supervising anesthesiologist is Sheldon Silvan,  M.D.   INDICATIONS:  Nicole Cherry is a 30 year old woman referred for evaluation  and management of a chronically painful and numb right hand.  Clinical  examination suggested carpal tunnel syndrome, and electrodiagnostic studies  confirmed median neuropathy.   Due to a failure respond to nonoperative measures, she is brought to the  operating room this time for release of her right transverse carpal  ligament.   PROCEDURE:  Nicole Cherry is brought to the operating room and placed in the  supine position upon the table.  Following induction of general anesthesia  by LMA, the right arm was prepped with Betadine soap and solution and  sterilely draped.  Following exsanguination of the limb with an Esmarch  bandage, the arterial tourniquet was insufflated to 230 mmHg.   The procedure commenced with a short incision in the line of the ring finger  in the palm. The subcutaneous tissues were carefully divided, revealing the  palmar fascia.  This was split longitudinally to  the reveal the common  sensory branch of the median nerve.  These were followed back to the  transverse carpal ligament, which was carefully isolated from the median  nerve.  The  ligament was then released along its ulnar border, extending  into the distal forearm.  This widely opened the carpal canal.   No mass or other predicaments were noted.   Bleeding points along the margin of the released ligament were cauterized  with bipolar current.  The wound was inspected for masses, and none were  identified.  The wound was __________ intradermal 3-0 Prolene suture.   A compressive dressing was applied with a volar plaster splint maintaining  the wrist in 5 degrees of dorsiflexion.      RVS/MEDQ  D:  02/17/2004  T:  02/17/2004  Job:  [id number]

## 2010-06-16 NOTE — Discharge Summary (Signed)
  NAMEMALIE, KASHANI               ACCOUNT NO.:  [id number]  MEDICAL RECORD NO.:  [id number]           PATIENT TYPE:  I  LOCATION:  9305                          [facility]ACILITY:  [facility]  PHYSICIAN:  Malva Limes, M.D.    DATE OF BIRTH:  February 13, 1980  DATE OF ADMISSION:  05/31/2010 DATE OF DISCHARGE:  06/05/2010                              DISCHARGE SUMMARY   PRINCIPAL DISCHARGE DIAGNOSES: 1. Morbid obesity. 2. Intrauterine pregnancy at 35 weeks estimated gestational age. 3. Uncontrolled diabetes mellitus. 4. Chronic hypertension. 5. Superimposed preeclampsia.  PRINCIPAL PROCEDURE: 1. Primary low transverse cesarean section. 2. Bilateral tubal ligation.  HISTORY OF PRESENT ILLNESS:  Ms. Amster patient is a 30 year old G7, P1-0-5-1 black female whose pregnancy was complicated by uncontrolled insulin requiring gestational diabetes, chronic hypertension with superimposed preeclampsia.  A complete description of the events which led up to the C-section can be found in the dictated operative note. The patient underwent a repeat low transverse cesarean section with bilateral tubal ligation on Jun 01, 2010.  This was performed by Dr. Carrington Clamp complete description of this can be found in the dictated operative note.  The patient's postop care was complicated by worsening preeclampsia requiring the ICU admission and management with hypertensive agents and magnesium.  On post day #3, the patient was transferred out of the ICU to the regular ward after she had adequately controlled blood pressures and blood sugars.  The patient was discharged to home on postop day #4, she was sent home with Percocet to take p.r.n. She was instructed to follow up in the office in 1 week.  She was to continue her labetalol 200 mg p.o. b.i.d. and her discharge insulin dosing was a.m. 30 units of N and 7 units of regular, 5:00 p.m. 7 units a regular, 9:00 p.m. 10 units of N.  She was to call the office if  she had any blood sugars less than 70 or greater than 150.  At the time of discharge, the patient was ambulating well.  She had normal GI and GU function.  She had no complaints of headache.          ______________________________ Malva Limes, M.D.     MA/MEDQ  D:  06/05/2010  T:  06/05/2010  Job:  [id number]  Electronically Signed by Malva Limes M.D. on 06/16/2010 03:59:22 PM

## 2011-08-03 IMAGING — US US OB FOLLOW-UP
1 series · 14 of 28 positions shown · non-contrast
Comparison: none

[Series 1: us ob follow-up · 0.27mm/px · 14 of 29 slices shown]
[im 2/29]
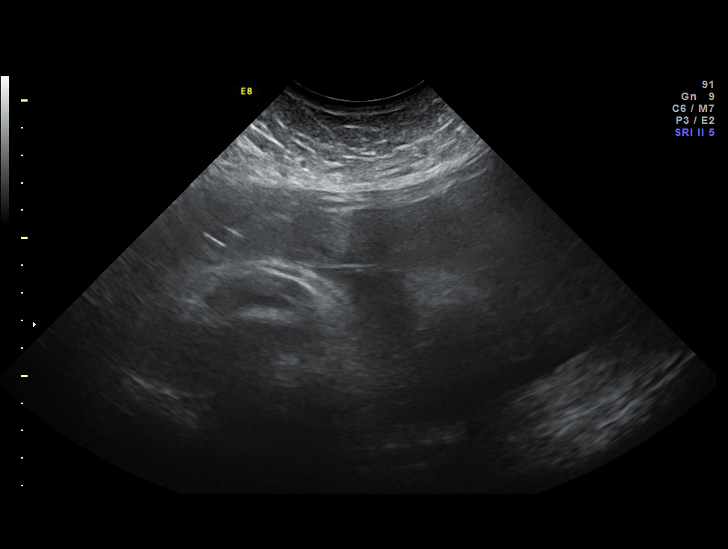
[im 4/29]
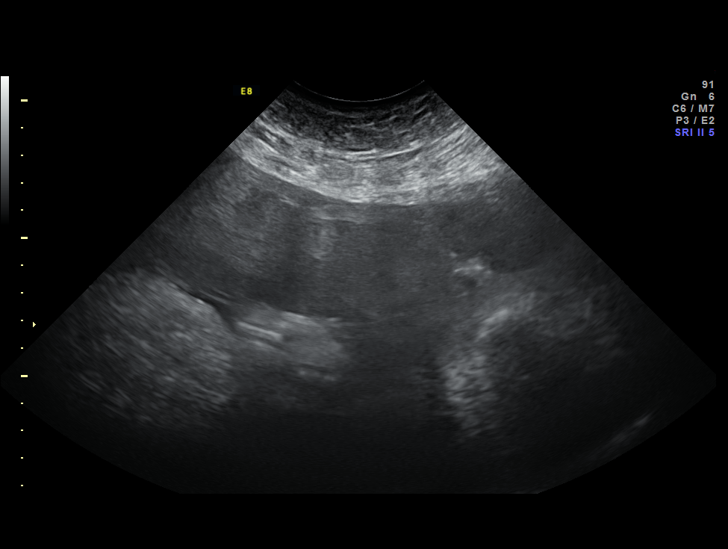
[im 6/29]
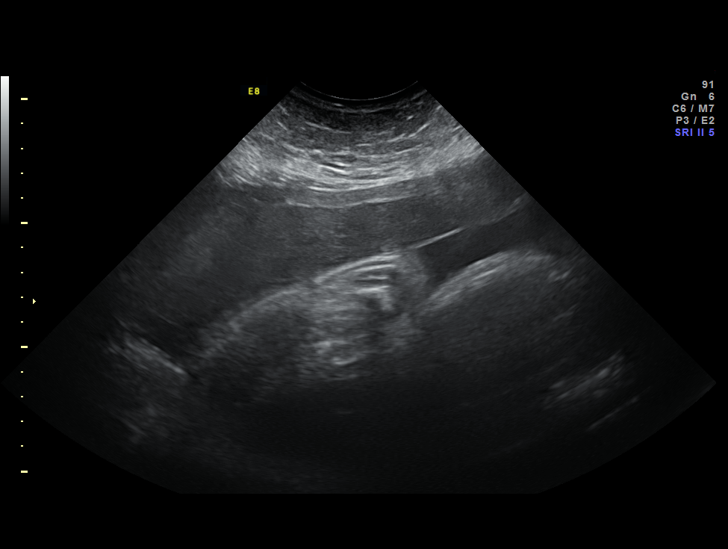
[im 8/29]
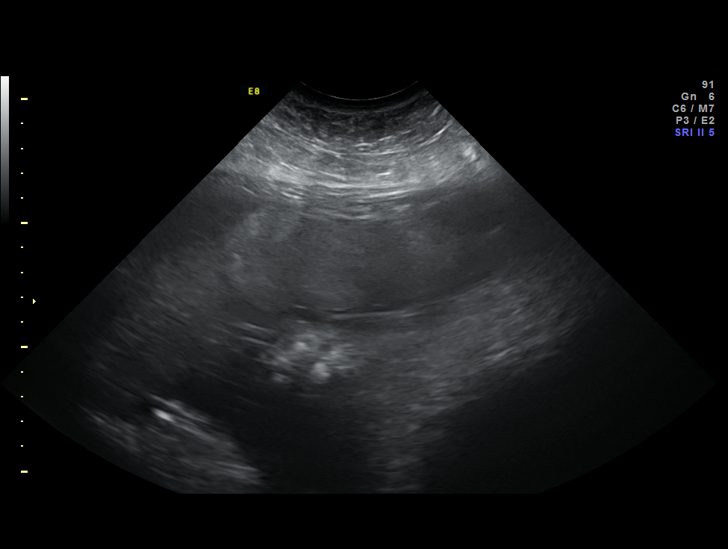
[im 10/29]
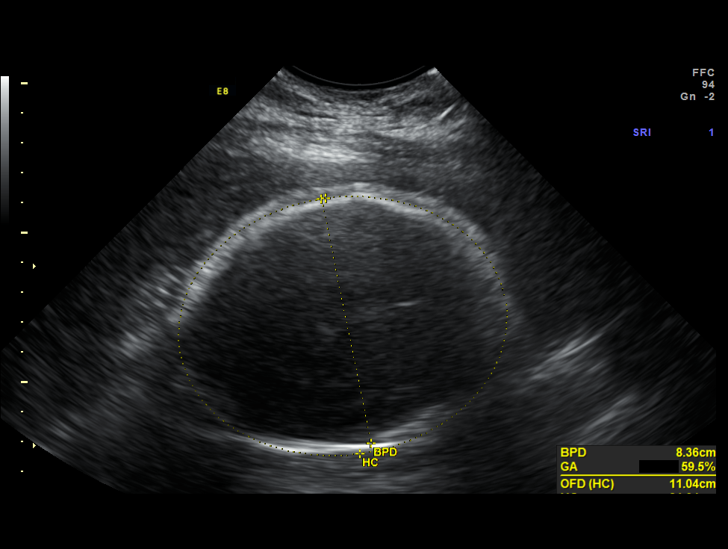
[im 12/29]
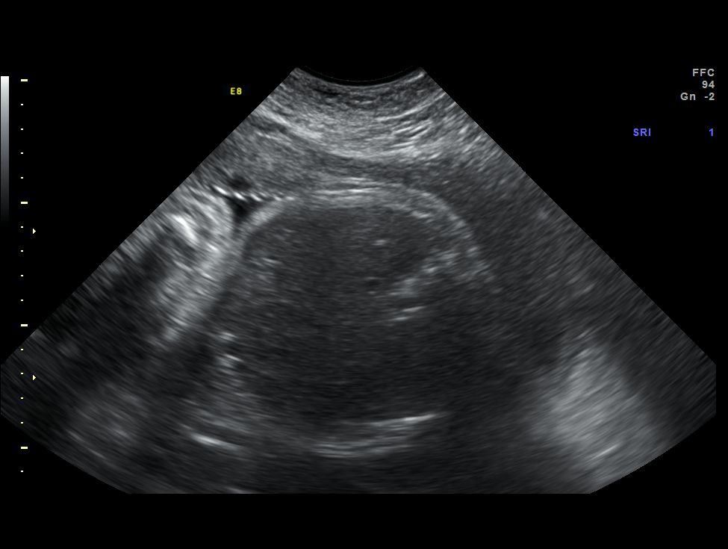
[im 14/29]
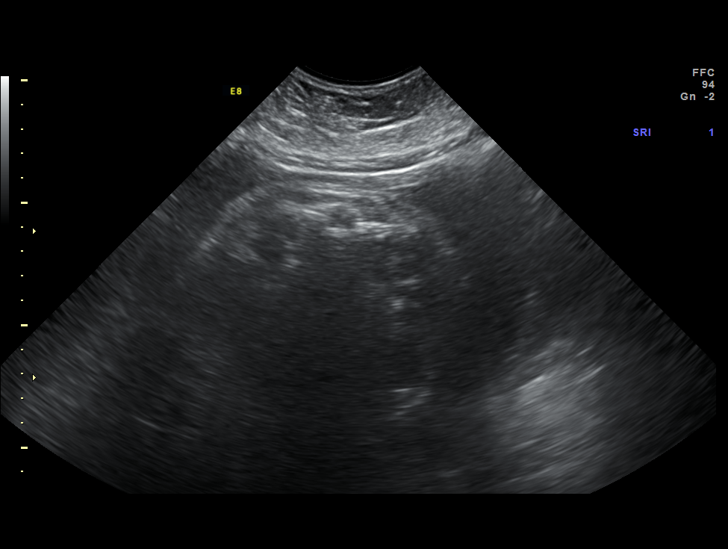
[im 16/29]
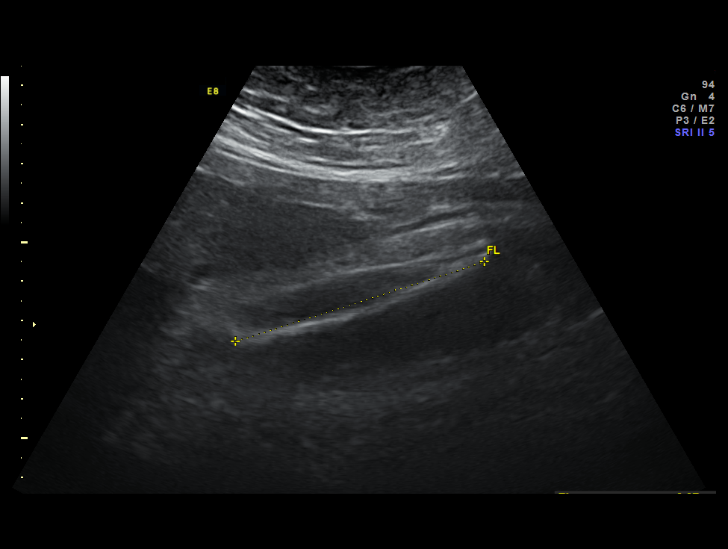
[im 18/29]
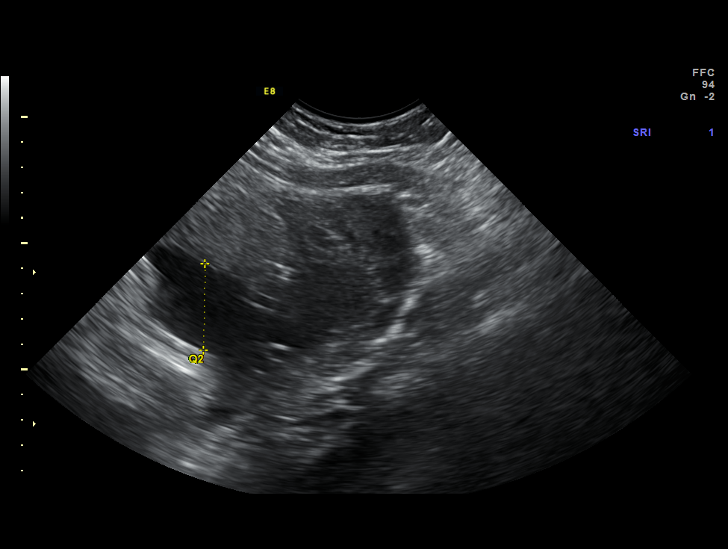
[im 20/29]
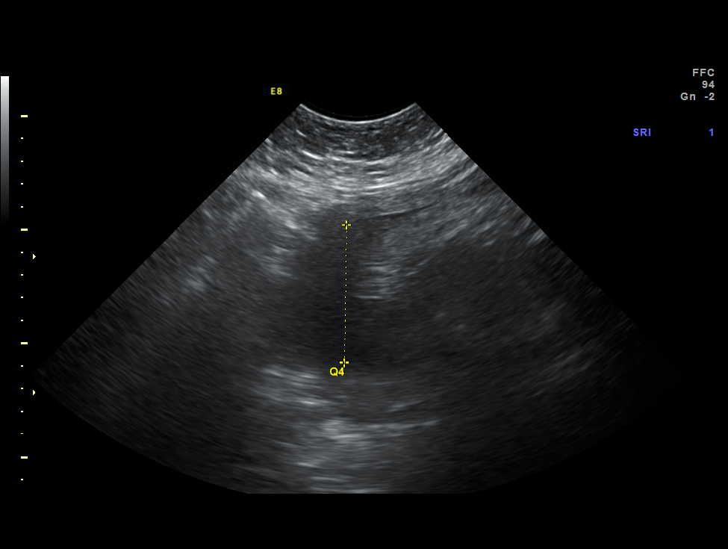
[im 22/29]
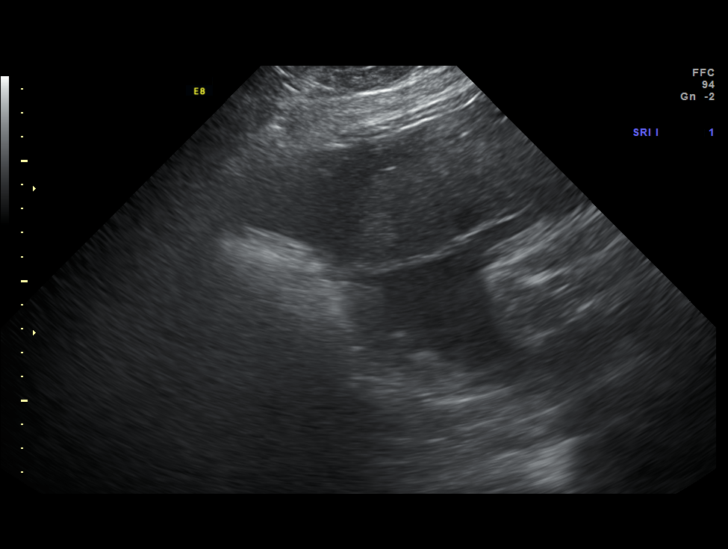
[im 24/29]
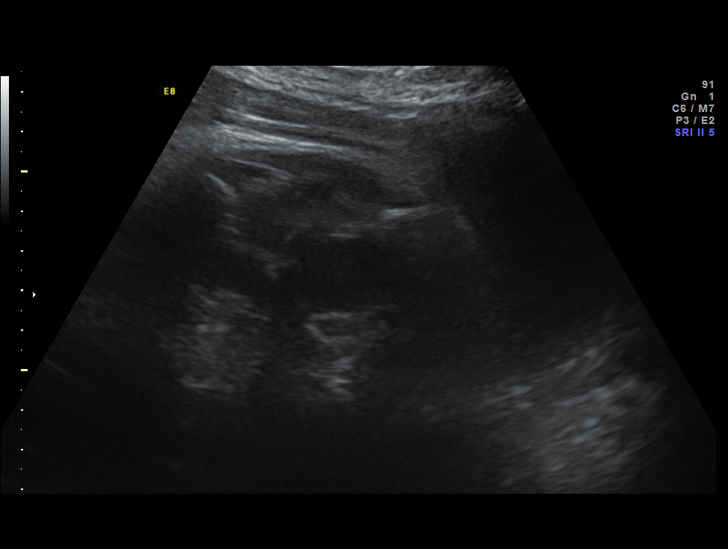
[im 26/29]
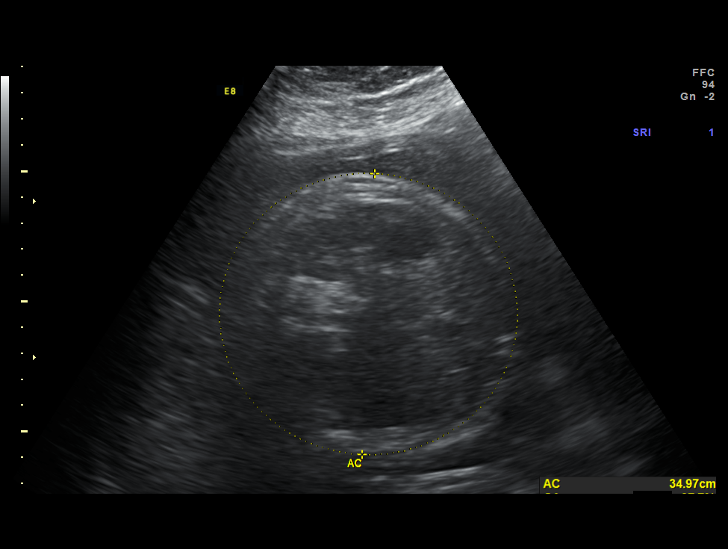
[im 29/29]
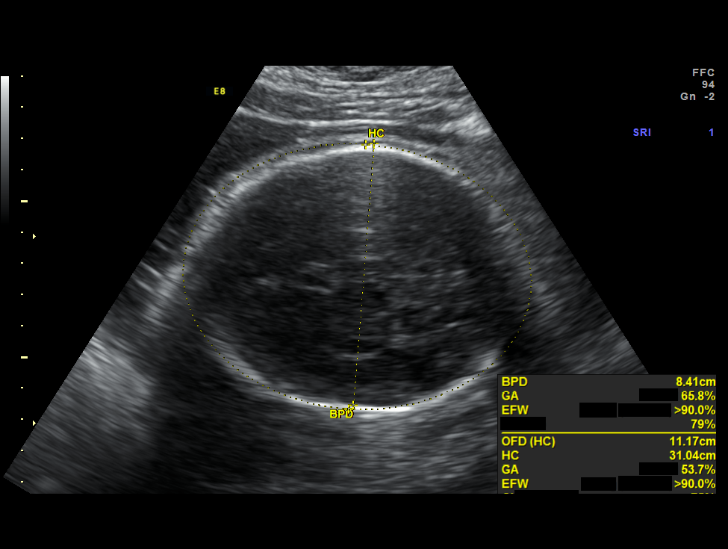

[14 of 28 positions shown; findings below may reference images not displayed]

Canned report from images found in remote index.

Refer to host system for actual result text.

## 2013-07-20 ENCOUNTER — Encounter (HOSPITAL_COMMUNITY): Payer: Self-pay | Admitting: Emergency Medicine

## 2013-07-20 ENCOUNTER — Emergency Department (INDEPENDENT_AMBULATORY_CARE_PROVIDER_SITE_OTHER)
Admission: EM | Admit: 2013-07-20 | Discharge: 2013-07-20 | Disposition: A | Discharge status: Home / Self Care | Payer: Self-pay | Source: Home / Self Care | Attending: Family Medicine | Admitting: Family Medicine

## 2013-07-20 DIAGNOSIS — H8111 Benign paroxysmal vertigo, right ear: Secondary | ICD-10-CM

## 2013-07-20 DIAGNOSIS — R7303 Prediabetes: Secondary | ICD-10-CM

## 2013-07-20 DIAGNOSIS — R7309 Other abnormal glucose: Secondary | ICD-10-CM

## 2013-07-20 DIAGNOSIS — H8110 Benign paroxysmal vertigo, unspecified ear: Secondary | ICD-10-CM

## 2013-07-20 LAB — POCT I-STAT, CHEM 8
BUN: 15 mg/dL (ref 6–23)
CALCIUM ION: 1.2 mmol/L (ref 1.12–1.23)
CREATININE: 0.9 mg/dL (ref 0.50–1.10)
Chloride: 97 mEq/L (ref 96–112)
Glucose, Bld: 152 mg/dL — ABNORMAL HIGH (ref 70–99)
HCT: 42 % (ref 36.0–46.0)
Hemoglobin: 14.3 g/dL (ref 12.0–15.0)
Potassium: 3.9 mEq/L (ref 3.7–5.3)
Sodium: 141 mEq/L (ref 137–147)
TCO2: 30 mmol/L (ref 0–100)

## 2013-07-20 LAB — POCT URINALYSIS DIP (DEVICE)
Bilirubin Urine: NEGATIVE
Glucose, UA: NEGATIVE mg/dL
Hgb urine dipstick: NEGATIVE
KETONES UR: NEGATIVE mg/dL
Leukocytes, UA: NEGATIVE
Nitrite: NEGATIVE
PH: 7 (ref 5.0–8.0)
PROTEIN: NEGATIVE mg/dL
SPECIFIC GRAVITY, URINE: 1.025 (ref 1.005–1.030)
Urobilinogen, UA: 1 mg/dL (ref 0.0–1.0)

## 2013-07-20 LAB — POCT PREGNANCY, URINE: Preg Test, Ur: NEGATIVE

## 2013-07-20 MED ORDER — MECLIZINE HCL 50 MG PO TABS: 50.0000 mg | ORAL_TABLET | Freq: Three times a day (TID) | ORAL | Status: DC | PRN

## 2013-07-20 MED ORDER — METFORMIN HCL 500 MG PO TABS: 500.0000 mg | ORAL_TABLET | Freq: Two times a day (BID) | ORAL | Status: DC

## 2013-07-20 NOTE — ED Notes (Signed)
Pt c/o feeling light headed and dizzy onset Sunday Reports LOC; hit head on dry wall Sx also include HA, urinary freq and freq thirst Denies f/v/n/d, cold sx Hx of borderline DM Alert w/no signs of acute distress.

## 2013-07-20 NOTE — Discharge Instructions (Signed)
Drink plenty of water , use medicine as prescribed, see your doctor for further care.

## 2013-07-20 NOTE — ED Provider Notes (Signed)
CSN: [id number]     Arrival date & time 07/20/13  1403 History   First MD Initiated Contact with Patient 07/20/13 1521     Chief Complaint  Patient presents with  . Dizziness   (Consider location/radiation/quality/duration/timing/severity/associated sxs/prior Treatment) Patient is a 33 y.o. female presenting with dizziness. The history is provided by the patient.  Dizziness Quality:  Imbalance and room spinning Severity:  Mild Onset quality:  Gradual Duration:  2 days Progression:  Improving Chronicity:  New Context: standing up   Context: not with loss of consciousness   Relieved by:  Being still Associated symptoms: no diarrhea, no headaches, no nausea, no palpitations, no tinnitus and no vomiting   Risk factors comment:  Concern re onset of DM   History reviewed. No pertinent past medical history. History reviewed. No pertinent past surgical history. No family history on file. History  Substance Use Topics  . Smoking status: Never Smoker   . Smokeless tobacco: Not on file  . Alcohol Use: No   OB History   Grav Para Term Preterm Abortions TAB SAB Ect Mult Living   1              Review of Systems  Constitutional: Negative.   HENT: Negative.  Negative for tinnitus.   Cardiovascular: Negative for palpitations.  Gastrointestinal: Negative.  Negative for nausea, vomiting and diarrhea.  Endocrine: Positive for polydipsia and polyuria.  Neurological: Positive for dizziness and light-headedness. Negative for headaches.    Allergies  Review of patient's allergies indicates no known allergies.  Home Medications   Prior to Admission medications   Medication Sig Start Date End Date Taking? Authorizing Provider  meclizine (ANTIVERT) 50 MG tablet Take 1 tablet (50 mg total) by mouth 3 (three) times daily as needed. For dizziness 07/20/13   Linna HoffJames D Kindl, MD  metFORMIN (GLUCOPHAGE) 500 MG tablet Take 1 tablet (500 mg total) by mouth 2 (two) times daily with a meal. 07/20/13    Linna HoffJames D Kindl, MD   BP 123/75  Pulse 78  Temp(Src) 99.1 F (37.3 C) (Oral)  Resp 20  SpO2 97%  LMP 07/12/2013  Breastfeeding? Unknown Physical Exam  Nursing note and vitals reviewed. Constitutional: She is oriented to person, place, and time. She appears well-developed and well-nourished.  HENT:  Head: Normocephalic.  Right Ear: External ear normal.  Left Ear: External ear normal.  Eyes: Conjunctivae and EOM are normal. Pupils are equal, round, and reactive to light.  Neck: Normal range of motion. Neck supple.  Cardiovascular: Regular rhythm and normal heart sounds.   Pulmonary/Chest: Effort normal and breath sounds normal.  Neurological: She is alert and oriented to person, place, and time. No cranial nerve deficit. Coordination normal.  Skin: Skin is warm and dry.    ED Course  Procedures (including critical care time) Labs Review Labs Reviewed  POCT I-STAT, CHEM 8 - Abnormal; Notable for the following:    Glucose, Bld 152 (*)    All other components within normal limits  POCT URINALYSIS DIP (DEVICE)  POCT PREGNANCY, URINE    Imaging Review No results found.   MDM   1. Glucose intolerance (pre-diabetes)   2. Vertigo, benign positional, right        Linna HoffJames D Kindl, MD 07/20/13 [phone]

## 2013-10-15 ENCOUNTER — Other Ambulatory Visit: Payer: Self-pay | Admitting: Pediatrics

## 2013-10-15 MED ORDER — PERMETHRIN 5 % EX CREA: 1.0000 "application " | TOPICAL_CREAM | Freq: Once | CUTANEOUS | Status: DC

## 2013-10-15 NOTE — Progress Notes (Signed)
Treating children for scabies so will treat mother as well.

## 2013-11-29 ENCOUNTER — Encounter (HOSPITAL_COMMUNITY): Payer: Self-pay | Admitting: Emergency Medicine

## 2013-12-07 ENCOUNTER — Emergency Department (INDEPENDENT_AMBULATORY_CARE_PROVIDER_SITE_OTHER)
Admission: EM | Admit: 2013-12-07 | Discharge: 2013-12-07 | Disposition: A | Discharge status: Home / Self Care | Payer: Self-pay | Source: Home / Self Care | Attending: Family Medicine | Admitting: Family Medicine

## 2013-12-07 ENCOUNTER — Encounter (HOSPITAL_COMMUNITY): Payer: Self-pay | Admitting: *Deleted

## 2013-12-07 MED ORDER — METHOCARBAMOL 500 MG PO TABS: 500.0000 mg | ORAL_TABLET | Freq: Three times a day (TID) | ORAL | Status: DC

## 2013-12-07 MED ORDER — DICLOFENAC POTASSIUM 50 MG PO TABS: 50.0000 mg | ORAL_TABLET | Freq: Three times a day (TID) | ORAL | Status: DC
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
## 2014-01-13 ENCOUNTER — Inpatient Hospital Stay (HOSPITAL_COMMUNITY): Payer: BC Managed Care – PPO

## 2014-01-13 ENCOUNTER — Inpatient Hospital Stay (HOSPITAL_COMMUNITY)
Admission: AD | Admit: 2014-01-13 | Discharge: 2014-01-13 | Disposition: A | Discharge status: Home / Self Care | Payer: BC Managed Care – PPO | Source: Ambulatory Visit | Attending: Obstetrics and Gynecology | Admitting: Obstetrics and Gynecology

## 2014-01-13 ENCOUNTER — Encounter (HOSPITAL_COMMUNITY): Payer: Self-pay | Admitting: *Deleted

## 2014-01-13 DIAGNOSIS — M549 Dorsalgia, unspecified: Secondary | ICD-10-CM | POA: Diagnosis present

## 2014-01-13 DIAGNOSIS — M25559 Pain in unspecified hip: Secondary | ICD-10-CM | POA: Diagnosis present

## 2014-01-13 DIAGNOSIS — B964 Proteus (mirabilis) (morganii) as the cause of diseases classified elsewhere: Secondary | ICD-10-CM | POA: Diagnosis not present

## 2014-01-13 DIAGNOSIS — N390 Urinary tract infection, site not specified: Secondary | ICD-10-CM | POA: Insufficient documentation

## 2014-01-13 DIAGNOSIS — E119 Type 2 diabetes mellitus without complications: Secondary | ICD-10-CM | POA: Insufficient documentation

## 2014-01-13 DIAGNOSIS — IMO0002 Reserved for concepts with insufficient information to code with codable children: Secondary | ICD-10-CM

## 2014-01-13 DIAGNOSIS — R102 Pelvic and perineal pain: Secondary | ICD-10-CM

## 2014-01-13 DIAGNOSIS — E1165 Type 2 diabetes mellitus with hyperglycemia: Secondary | ICD-10-CM

## 2014-01-13 DIAGNOSIS — Z98891 History of uterine scar from previous surgery: Secondary | ICD-10-CM

## 2014-01-13 HISTORY — DX: History of uterine scar from previous surgery: Z98.891

## 2014-01-13 HISTORY — DX: Type 2 diabetes mellitus without complications: E11.9

## 2014-01-13 LAB — URINALYSIS, ROUTINE W REFLEX MICROSCOPIC
BILIRUBIN URINE: NEGATIVE
GLUCOSE, UA: NEGATIVE mg/dL
Ketones, ur: NEGATIVE mg/dL
Nitrite: NEGATIVE
Protein, ur: NEGATIVE mg/dL
Urobilinogen, UA: 0.2 mg/dL (ref 0.0–1.0)
pH: 5.5 (ref 5.0–8.0)

## 2014-01-13 LAB — CBC WITH DIFFERENTIAL/PLATELET
BASOS ABS: 0 10*3/uL (ref 0.0–0.1)
Basophils Relative: 0 % (ref 0–1)
EOS PCT: 0 % (ref 0–5)
Eosinophils Absolute: 0.1 10*3/uL (ref 0.0–0.7)
HCT: 34.4 % — ABNORMAL LOW (ref 36.0–46.0)
Hemoglobin: 11 g/dL — ABNORMAL LOW (ref 12.0–15.0)
Lymphocytes Relative: 18 % (ref 12–46)
Lymphs Abs: 2.1 10*3/uL (ref 0.7–4.0)
MCH: 26.3 pg (ref 26.0–34.0)
MCHC: 32 g/dL (ref 30.0–36.0)
MCV: 82.1 fL (ref 78.0–100.0)
Monocytes Absolute: 0.7 10*3/uL (ref 0.1–1.0)
Monocytes Relative: 6 % (ref 3–12)
NEUTROS ABS: 8.6 10*3/uL — AB (ref 1.7–7.7)
NEUTROS PCT: 76 % (ref 43–77)
Platelets: 329 10*3/uL (ref 150–400)
RBC: 4.19 MIL/uL (ref 3.87–5.11)
RDW: 15.3 % (ref 11.5–15.5)
WBC: 11.5 10*3/uL — AB (ref 4.0–10.5)

## 2014-01-13 LAB — URINE MICROSCOPIC-ADD ON

## 2014-01-13 LAB — COMPREHENSIVE METABOLIC PANEL
ALT: 12 U/L (ref 0–35)
AST: 13 U/L (ref 0–37)
Albumin: 3.3 g/dL — ABNORMAL LOW (ref 3.5–5.2)
Alkaline Phosphatase: 110 U/L (ref 39–117)
Anion gap: 15 (ref 5–15)
BUN: 12 mg/dL (ref 6–23)
CALCIUM: 9.2 mg/dL (ref 8.4–10.5)
CO2: 25 mEq/L (ref 19–32)
Chloride: 100 mEq/L (ref 96–112)
Creatinine, Ser: 0.65 mg/dL (ref 0.50–1.10)
GFR calc Af Amer: >90 mL/min (ref >90)
Glucose, Bld: 253 mg/dL — ABNORMAL HIGH (ref 70–99)
Potassium: 4.1 mEq/L (ref 3.7–5.3)
Sodium: 140 mEq/L (ref 137–147)
Total Bilirubin: 0.3 mg/dL (ref 0.3–1.2)
Total Protein: 7.6 g/dL (ref 6.0–8.3)

## 2014-01-13 LAB — POCT PREGNANCY, URINE: PREG TEST UR: NEGATIVE

## 2014-01-13 MED ORDER — OXYCODONE-ACETAMINOPHEN 5-325 MG PO TABS: 1.0000 | ORAL_TABLET | ORAL | Status: DC | PRN

## 2014-01-13 MED ORDER — KETOROLAC TROMETHAMINE 30 MG/ML IJ SOLN
30.0000 mg | Freq: Once | INTRAMUSCULAR | Status: AC
  Administered 2014-01-13: 30 mg via INTRAVENOUS
  Filled 2014-01-13: qty 1

## 2014-01-13 NOTE — MAU Note (Signed)
Pt reports she has had pain in her left lower back that radiates down her leg. Had novasure done in office 2 weeks ago. curently being treated for UTI.

## 2014-01-13 NOTE — Discharge Instructions (Signed)
Pelvic Pain Female pelvic pain can be caused by many different things and start from a variety of places. Pelvic pain refers to pain that is located in the lower half of the abdomen and between your hips. The pain may occur over a short period of time (acute) or may be reoccurring (chronic). The cause of pelvic pain may be related to disorders affecting the female reproductive organs (gynecologic), but it may also be related to the bladder, kidney stones, an intestinal complication, or muscle or skeletal problems. Getting help right away for pelvic pain is important, especially if there has been severe, sharp, or a sudden onset of unusual pain. It is also important to get help right away because some types of pelvic pain can be life threatening.  CAUSES  Below are only some of the causes of pelvic pain. The causes of pelvic pain can be in one of several categories.   Gynecologic.  Pelvic inflammatory disease.  Sexually transmitted infection.  Ovarian cyst or a twisted ovarian ligament (ovarian torsion).  Uterine lining that grows outside the uterus (endometriosis).  Fibroids, cysts, or tumors.  Ovulation.  Pregnancy.  Pregnancy that occurs outside the uterus (ectopic pregnancy).  Miscarriage.  Labor.  Abruption of the placenta or ruptured uterus.  Infection.  Uterine infection (endometritis).  Bladder infection.  Diverticulitis.  Miscarriage related to a uterine infection (septic abortion).  Bladder.  Inflammation of the bladder (cystitis).  Kidney stone(s).  Gastrointestinal.  Constipation.  Diverticulitis.  Neurologic.  Trauma.  Feeling pelvic pain because of mental or emotional causes (psychosomatic).  Cancers of the bowel or pelvis. EVALUATION  Your caregiver will want to take a careful history of your concerns. This includes recent changes in your health, a careful gynecologic history of your periods (menses), and a sexual history. Obtaining your family  history and medical history is also important. Your caregiver may suggest a pelvic exam. A pelvic exam will help identify the location and severity of the pain. It also helps in the evaluation of which organ system may be involved. In order to identify the cause of the pelvic pain and be properly treated, your caregiver may order tests. These tests may include:   A pregnancy test.  Pelvic ultrasonography.  An X-ray exam of the abdomen.  A urinalysis or evaluation of vaginal discharge.  Blood tests. HOME CARE INSTRUCTIONS   Only take over-the-counter or prescription medicines for pain, discomfort, or fever as directed by your caregiver.   Rest as directed by your caregiver.   Eat a balanced diet.   Drink enough fluids to make your urine clear or pale yellow, or as directed.   Avoid sexual intercourse if it causes pain.   Apply warm or cold compresses to the lower abdomen depending on which one helps the pain.   Avoid stressful situations.   Keep a journal of your pelvic pain. Write down when it started, where the pain is located, and if there are things that seem to be associated with the pain, such as food or your menstrual cycle.  Follow up with your caregiver as directed.  SEEK MEDICAL CARE IF:  Your medicine does not help your pain.  You have abnormal vaginal discharge. SEEK IMMEDIATE MEDICAL CARE IF:   You have heavy bleeding from the vagina.   Your pelvic pain increases.   You feel light-headed or faint.   You have chills.   You have pain with urination or blood in your urine.   You have uncontrolled diarrhea   or vomiting.   You have a fever or persistent symptoms for more than 3 days.  You have a fever and your symptoms suddenly get worse.   You are being physically or sexually abused.  MAKE SURE YOU:  Understand these instructions.  Will watch your condition.  Will get help if you are not doing well or get worse. Document Released:  12/12/2003 Document Revised: 05/31/2013 Document Reviewed: 05/06/2011 Exit[employer] Patient Information 2015 [employer]. This information is not intended to replace advice given to you by your health care provider. Make sure you discuss any questions you have with your health care provider.  

## 2014-01-13 NOTE — MAU Provider Note (Signed)
History   33 yo [id number] presented after calling office c/o persistent back pain after Novasure ablation 12/31/13 with Dr. Estanislado Pandy.  Seen in f/u on 12/7 for same c/o--CBC and CMP WNL, pelvic US WNL.  Home on Doxycycline (100 mg po BID x 14 days) and Vicodin.  Also dx with proteus UTI by culture, on Cipro x 5 days (started 12/16/150.  Has small amount dark bleeding.  Pain across lower back, extending down into left leg.  Denies fever (had single fever 12/4), N/V/diarrhea, or dysuria.  No Ibuprophen in last 18 hours, Vicodin through night.   Patient Active Problem List   Diagnosis Date Noted  . Pain in joint, pelvic region and thigh 01/13/2014  . Morbid obesity 01/13/2014  . H/O: cesarean section x 2 01/13/2014    Chief Complaint  Patient presents with  . Abdominal Pain   HPI:  See above  OB History    Gravida Para Term Preterm AB TAB SAB Ectopic Multiple Living   7 2  2 5  5   2       Past Medical History  Diagnosis Date  . Diabetes     Past Surgical History  Procedure Laterality Date  . Tubal ligation    . Cesarean section      No family history on file.  History  Substance Use Topics  . Smoking status: Never Smoker   . Smokeless tobacco: Not on file  . Alcohol Use: No    Allergies: No Known Allergies  Prescriptions prior to admission  Medication Sig Dispense Refill Last Dose  . ciprofloxacin (CIPRO) 250 MG tablet Take 250 mg by mouth 2 (two) times daily.   01/12/2014 at Unknown time  . doxycycline (VIBRA-TABS) 100 MG tablet Take 100 mg by mouth 2 (two) times daily.   01/12/2014 at Unknown time  . metFORMIN (GLUCOPHAGE) 500 MG tablet Take 1 tablet (500 mg total) by mouth 2 (two) times daily with a meal. 60 tablet 0 01/12/2014 at Unknown time  . diclofenac (CATAFLAM) 50 MG tablet Take 1 tablet (50 mg total) by mouth 3 (three) times daily. (Patient not taking: Reported on 01/13/2014) 30 tablet 0   . meclizine (ANTIVERT) 50 MG tablet Take 1 tablet (50 mg total) by mouth  3 (three) times daily as needed. For dizziness (Patient not taking: Reported on 01/13/2014) 30 tablet 0   . methocarbamol (ROBAXIN) 500 MG tablet Take 1 tablet (500 mg total) by mouth 3 (three) times daily. Muscle relaxer (Patient not taking: Reported on 01/13/2014) 30 tablet 0   . permethrin (ELIMITE) 5 % cream Apply 1 application topically once. See patient instructions for dosing. (Patient not taking: Reported on 01/13/2014) 60 g 1     ROS:  Low back pain, extending on left down into upper thigh.  Vaginal bleeding Physical Exam   Blood pressure 118/77, pulse 101, temperature 98.5 F (36.9 C), temperature source Oral, resp. rate 18, height 5\' 1"  (1.549 m), weight 263 lb 9.6 oz (119.568 kg), last menstrual period 12/09/2013, unknown if currently breastfeeding.  Physical Exam  In NAD Chest clear Heart RRR without murmur Abd soft, NT Pelvic--small amount dark vaginal bleeding, no CMT. Uterus small, NT. Ext WNL   ED Course  Assessment: S/P Novasure ablation 12/4--persistence of pain Diabetes, on Metformin Type 2 DM  Plan: Consulted with Dr. Stefano Gaul. Pelvic US CBC, diff, CMP GC/chlamydia on urine UA Toradol 30 mg IM now.   Nigel Bridgeman CNM, MSN 01/13/2014 1:26 PM  Addendum:  Returned  from US: Uterus 11.2 x 5 x 5.8 Entire canal filled with echogenic foci 3 simple cysts on right ovary--1.8 x 1.6 x 1.3, 1.2 x 1 x 1.3, 1.9 x 1.6 x 1.5 No free fluid  Received Toradol 30 mg IM at 1203--"some benefit"  Results for orders placed or performed during the hospital encounter of 01/13/14 (from the past 24 hour(s))  Urinalysis, Routine w reflex microscopic     Status: Abnormal   Collection Time: 01/13/14 11:06 AM  Result Value Ref Range   Color, Urine YELLOW YELLOW   APPearance HAZY (A) CLEAR   Specific Gravity, Urine >1.030 (H) 1.005 - 1.030   pH 5.5 5.0 - 8.0   Glucose, UA NEGATIVE NEGATIVE mg/dL   Hgb urine dipstick LARGE (A) NEGATIVE   Bilirubin Urine NEGATIVE  NEGATIVE   Ketones, ur NEGATIVE NEGATIVE mg/dL   Protein, ur NEGATIVE NEGATIVE mg/dL   Urobilinogen, UA 0.2 0.0 - 1.0 mg/dL   Nitrite NEGATIVE NEGATIVE   Leukocytes, UA TRACE (A) NEGATIVE  Urine microscopic-add on     Status: Abnormal   Collection Time: 01/13/14 11:06 AM  Result Value Ref Range   Squamous Epithelial / LPF FEW (A) RARE   WBC, UA 3-6 <3 WBC/hpf   RBC / HPF 21-50 <3 RBC/hpf  Pregnancy, urine POC     Status: None   Collection Time: 01/13/14 11:13 AM  Result Value Ref Range   Preg Test, Ur NEGATIVE NEGATIVE  CBC with Differential     Status: Abnormal   Collection Time: 01/13/14 12:00 PM  Result Value Ref Range   WBC 11.5 (H) 4.0 - 10.5 K/uL   RBC 4.19 3.87 - 5.11 MIL/uL   Hemoglobin 11.0 (L) 12.0 - 15.0 g/dL   HCT 16.134.4 (L) 09.636.0 - 04.546.0 %   MCV 82.1 78.0 - 100.0 fL   MCH 26.3 26.0 - 34.0 pg   MCHC 32.0 30.0 - 36.0 g/dL   RDW 40.915.3 81.111.5 - 91.415.5 %   Platelets 329 150 - 400 K/uL   Neutrophils Relative % 76 43 - 77 %   Neutro Abs 8.6 (H) 1.7 - 7.7 K/uL   Lymphocytes Relative 18 12 - 46 %   Lymphs Abs 2.1 0.7 - 4.0 K/uL   Monocytes Relative 6 3 - 12 %   Monocytes Absolute 0.7 0.1 - 1.0 K/uL   Eosinophils Relative 0 0 - 5 %   Eosinophils Absolute 0.1 0.0 - 0.7 K/uL   Basophils Relative 0 0 - 1 %   Basophils Absolute 0.0 0.0 - 0.1 K/uL  Comprehensive metabolic panel     Status: Abnormal   Collection Time: 01/13/14 12:00 PM  Result Value Ref Range   Sodium 140 137 - 147 mEq/L   Potassium 4.1 3.7 - 5.3 mEq/L   Chloride 100 96 - 112 mEq/L   CO2 25 19 - 32 mEq/L   Glucose, Bld 253 (H) 70 - 99 mg/dL   BUN 12 6 - 23 mg/dL   Creatinine, Ser 7.820.65 0.50 - 1.10 mg/dL   Calcium 9.2 8.4 - 95.610.5 mg/dL   Total Protein 7.6 6.0 - 8.3 g/dL   Albumin 3.3 (L) 3.5 - 5.2 g/dL   AST 13 0 - 37 U/L   ALT 12 0 - 35 U/L   Alkaline Phosphatase 110 39 - 117 U/L   Total Bilirubin 0.3 0.3 - 1.2 mg/dL   GFR calc non Af Amer >90 >90 mL/min   GFR calc Af Amer >90 >90 mL/min  Anion gap 15 5 -  15   Consulted with Dr. Stefano GaulStringer. No evidence of perforation, abscess, or sepsis.  D/C home. Rx Percocet 5/325, with discussion of use. Continue on Cipro for UTI and Doxy for possible endometritis. Increase rest and fluids. Take Ibuprophen 600 mg po q 6 hours x 24 hours again. F/u as scheduled in office on Monday, call with any worsening of sx.  Nigel BridgemanVicki Cordarryl Monrreal, CNM 01/13/14 2:45p

## 2014-01-13 NOTE — Progress Notes (Signed)
Inpatient Diabetes Program Recommendations  AACE/ADA: New Consensus Statement on Inpatient Glycemic Control (2013)  Target Ranges:  Prepandial:   less than 140 mg/dL      Peak postprandial:   less than 180 mg/dL (1-2 hours)      Critically ill patients:  140 - 180 mg/dL   Results for Nicole DauerMCCUTCHEN, Wendi J (MRN [id number]) as of 01/13/2014 14:19  Ref. Range 01/13/2014 12:00  Glucose Latest Range: 70-99 mg/dL 213253 (H)    Diabetes history: DM2 Outpatient Diabetes medications: Metformin 500 mg BID Current orders for Inpatient glycemic control: None  Inpatient Diabetes Program Recommendations Correction (SSI): If admitted, please order CBGs wtih Novolog correction scale. HgbA1C: If admitted, please consider ordering an A1C to evaluate glycemic control over the past 2-3 months.  Thanks, Orlando PennerMarie Bethany Cumming, RN, MSN, CCRN, CDE Diabetes Coordinator Inpatient Diabetes Program [phone] (Team Pager) [phone] ([facility] office) [phone] [facility] office)

## 2014-01-14 LAB — GC/CHLAMYDIA PROBE AMP
CT Probe RNA: NEGATIVE
GC PROBE AMP APTIMA: NEGATIVE

## 2014-01-17 ENCOUNTER — Inpatient Hospital Stay (HOSPITAL_COMMUNITY)
Admission: AD | Admit: 2014-01-17 | Discharge: 2014-01-26 | DRG: 758 | Disposition: A | Discharge status: Home / Self Care | Payer: BC Managed Care – PPO | Source: Ambulatory Visit | Attending: Obstetrics and Gynecology | Admitting: Obstetrics and Gynecology

## 2014-01-17 ENCOUNTER — Emergency Department (HOSPITAL_COMMUNITY): Payer: BC Managed Care – PPO

## 2014-01-17 ENCOUNTER — Encounter (HOSPITAL_COMMUNITY): Payer: Self-pay | Admitting: *Deleted

## 2014-01-17 ENCOUNTER — Other Ambulatory Visit (HOSPITAL_COMMUNITY): Payer: Self-pay | Admitting: Obstetrics and Gynecology

## 2014-01-17 DIAGNOSIS — R0601 Orthopnea: Secondary | ICD-10-CM

## 2014-01-17 DIAGNOSIS — Z6841 Body Mass Index (BMI) 40.0 and over, adult: Secondary | ICD-10-CM | POA: Diagnosis not present

## 2014-01-17 DIAGNOSIS — N719 Inflammatory disease of uterus, unspecified: Secondary | ICD-10-CM | POA: Diagnosis present

## 2014-01-17 DIAGNOSIS — B009 Herpesviral infection, unspecified: Secondary | ICD-10-CM | POA: Diagnosis present

## 2014-01-17 DIAGNOSIS — K439 Ventral hernia without obstruction or gangrene: Secondary | ICD-10-CM | POA: Diagnosis present

## 2014-01-17 DIAGNOSIS — N939 Abnormal uterine and vaginal bleeding, unspecified: Secondary | ICD-10-CM | POA: Diagnosis present

## 2014-01-17 DIAGNOSIS — R1011 Right upper quadrant pain: Secondary | ICD-10-CM | POA: Diagnosis present

## 2014-01-17 DIAGNOSIS — E1165 Type 2 diabetes mellitus with hyperglycemia: Secondary | ICD-10-CM | POA: Diagnosis not present

## 2014-01-17 DIAGNOSIS — R1013 Epigastric pain: Secondary | ICD-10-CM | POA: Insufficient documentation

## 2014-01-17 DIAGNOSIS — B952 Enterococcus as the cause of diseases classified elsewhere: Secondary | ICD-10-CM | POA: Diagnosis present

## 2014-01-17 DIAGNOSIS — R509 Fever, unspecified: Secondary | ICD-10-CM | POA: Insufficient documentation

## 2014-01-17 DIAGNOSIS — K5900 Constipation, unspecified: Secondary | ICD-10-CM | POA: Diagnosis present

## 2014-01-17 DIAGNOSIS — Z794 Long term (current) use of insulin: Secondary | ICD-10-CM

## 2014-01-17 DIAGNOSIS — R52 Pain, unspecified: Secondary | ICD-10-CM

## 2014-01-17 DIAGNOSIS — Z9851 Tubal ligation status: Secondary | ICD-10-CM

## 2014-01-17 DIAGNOSIS — E119 Type 2 diabetes mellitus without complications: Secondary | ICD-10-CM | POA: Diagnosis present

## 2014-01-17 DIAGNOSIS — R109 Unspecified abdominal pain: Secondary | ICD-10-CM

## 2014-01-17 DIAGNOSIS — IMO0002 Reserved for concepts with insufficient information to code with codable children: Secondary | ICD-10-CM | POA: Diagnosis present

## 2014-01-17 DIAGNOSIS — R5082 Postprocedural fever: Secondary | ICD-10-CM

## 2014-01-17 DIAGNOSIS — A491 Streptococcal infection, unspecified site: Secondary | ICD-10-CM | POA: Insufficient documentation

## 2014-01-17 DIAGNOSIS — D72829 Elevated white blood cell count, unspecified: Secondary | ICD-10-CM | POA: Diagnosis not present

## 2014-01-17 HISTORY — DX: History of uterine scar from previous surgery: Z98.891

## 2014-01-17 LAB — COMPREHENSIVE METABOLIC PANEL
ALK PHOS: 117 U/L (ref 39–117)
ALT: 14 U/L (ref 0–35)
AST: 14 U/L (ref 0–37)
Albumin: 3.8 g/dL (ref 3.5–5.2)
Anion gap: 14 (ref 5–15)
BILIRUBIN TOTAL: 0.5 mg/dL (ref 0.3–1.2)
BUN: 13 mg/dL (ref 6–23)
CHLORIDE: 94 meq/L — AB (ref 96–112)
CO2: 28 meq/L (ref 19–32)
Calcium: 9 mg/dL (ref 8.4–10.5)
Creatinine, Ser: 0.71 mg/dL (ref 0.50–1.10)
GFR calc non Af Amer: >90 mL/min (ref >90)
GLUCOSE: 207 mg/dL — AB (ref 70–99)
POTASSIUM: 3.9 meq/L (ref 3.7–5.3)
Sodium: 136 mEq/L — ABNORMAL LOW (ref 137–147)
Total Protein: 8.3 g/dL (ref 6.0–8.3)

## 2014-01-17 LAB — URINALYSIS, ROUTINE W REFLEX MICROSCOPIC
BILIRUBIN URINE: NEGATIVE
Glucose, UA: NEGATIVE mg/dL
KETONES UR: NEGATIVE mg/dL
NITRITE: NEGATIVE
Protein, ur: 30 mg/dL — AB
Specific Gravity, Urine: 1.028 (ref 1.005–1.030)
UROBILINOGEN UA: 1 mg/dL (ref 0.0–1.0)
pH: 5.5 (ref 5.0–8.0)

## 2014-01-17 LAB — URINE MICROSCOPIC-ADD ON

## 2014-01-17 LAB — CBC WITH DIFFERENTIAL/PLATELET
Basophils Absolute: 0 10*3/uL (ref 0.0–0.1)
Basophils Relative: 0 % (ref 0–1)
Eosinophils Absolute: 0 10*3/uL (ref 0.0–0.7)
Eosinophils Relative: 0 % (ref 0–5)
HCT: 35.2 % — ABNORMAL LOW (ref 36.0–46.0)
HEMOGLOBIN: 11.1 g/dL — AB (ref 12.0–15.0)
LYMPHS PCT: 12 % (ref 12–46)
Lymphs Abs: 1.8 10*3/uL (ref 0.7–4.0)
MCH: 25.9 pg — ABNORMAL LOW (ref 26.0–34.0)
MCHC: 31.5 g/dL (ref 30.0–36.0)
MCV: 82.1 fL (ref 78.0–100.0)
MONO ABS: 0.6 10*3/uL (ref 0.1–1.0)
MONOS PCT: 4 % (ref 3–12)
Neutro Abs: 13.1 10*3/uL — ABNORMAL HIGH (ref 1.7–7.7)
Neutrophils Relative %: 84 % — ABNORMAL HIGH (ref 43–77)
Platelets: 338 10*3/uL (ref 150–400)
RBC: 4.29 MIL/uL (ref 3.87–5.11)
RDW: 15.1 % (ref 11.5–15.5)
WBC: 15.5 10*3/uL — AB (ref 4.0–10.5)

## 2014-01-17 LAB — PREGNANCY, URINE: Preg Test, Ur: NEGATIVE

## 2014-01-17 LAB — LIPASE, BLOOD: Lipase: 26 U/L (ref 11–59)

## 2014-01-17 MED ORDER — SODIUM CHLORIDE 0.9 % IJ SOLN: 3.0000 mL | Freq: Two times a day (BID) | INTRAMUSCULAR | Status: DC

## 2014-01-17 MED ORDER — SODIUM CHLORIDE 0.9 % IJ SOLN: 3.0000 mL | INTRAMUSCULAR | Status: DC | PRN

## 2014-01-17 MED ORDER — LEVOFLOXACIN IN D5W 500 MG/100ML IV SOLN
500.0000 mg | Freq: Once | INTRAVENOUS | Status: DC
  Filled 2014-01-17: qty 100

## 2014-01-17 MED ORDER — SIMETHICONE 80 MG PO CHEW
80.0000 mg | CHEWABLE_TABLET | Freq: Four times a day (QID) | ORAL | Status: DC | PRN
  Administered 2014-01-19 – 2014-01-20 (×2): 80 mg via ORAL
  Filled 2014-01-17 (×2): qty 1

## 2014-01-17 MED ORDER — ONDANSETRON HCL 4 MG PO TABS
4.0000 mg | ORAL_TABLET | Freq: Four times a day (QID) | ORAL | Status: DC | PRN
  Administered 2014-01-18 – 2014-01-20 (×4): 4 mg via ORAL
  Filled 2014-01-17 (×4): qty 1

## 2014-01-17 MED ORDER — IOHEXOL 300 MG/ML  SOLN
50.0000 mL | Freq: Once | INTRAMUSCULAR | Status: AC | PRN
  Administered 2014-01-17: 50 mL via ORAL

## 2014-01-17 MED ORDER — SODIUM CHLORIDE 0.9 % IV SOLN: 250.0000 mL | INTRAVENOUS | Status: DC | PRN

## 2014-01-17 MED ORDER — SODIUM CHLORIDE 0.9 % IV BOLUS (SEPSIS)
1000.0000 mL | Freq: Once | INTRAVENOUS | Status: AC
  Administered 2014-01-17: 1000 mL via INTRAVENOUS

## 2014-01-17 MED ORDER — HYDROCODONE-ACETAMINOPHEN 5-325 MG PO TABS
1.0000 | ORAL_TABLET | ORAL | Status: DC | PRN
  Administered 2014-01-17: 1 via ORAL
  Administered 2014-01-18 (×2): 2 via ORAL
  Filled 2014-01-17 (×3): qty 2

## 2014-01-17 MED ORDER — MENTHOL 3 MG MT LOZG
1.0000 | LOZENGE | OROMUCOSAL | Status: DC | PRN
  Administered 2014-01-22: 3 mg via ORAL
  Filled 2014-01-17: qty 9

## 2014-01-17 MED ORDER — ACETAMINOPHEN 325 MG PO TABS
650.0000 mg | ORAL_TABLET | ORAL | Status: DC | PRN
  Administered 2014-01-19 – 2014-01-22 (×5): 650 mg via ORAL
  Filled 2014-01-17 (×6): qty 2

## 2014-01-17 MED ORDER — GUAIFENESIN 100 MG/5ML PO SOLN: 15.0000 mL | ORAL | Status: DC | PRN

## 2014-01-17 MED ORDER — DOXYCYCLINE HYCLATE 100 MG PO TABS
100.0000 mg | ORAL_TABLET | Freq: Two times a day (BID) | ORAL | Status: DC
  Administered 2014-01-17 – 2014-01-18 (×2): 100 mg via ORAL
  Filled 2014-01-17 (×2): qty 1

## 2014-01-17 MED ORDER — HYDROMORPHONE HCL 1 MG/ML IJ SOLN
1.0000 mg | Freq: Once | INTRAMUSCULAR | Status: AC
  Administered 2014-01-17: 1 mg via INTRAVENOUS
  Filled 2014-01-17: qty 1

## 2014-01-17 MED ORDER — LEVOFLOXACIN IN D5W 500 MG/100ML IV SOLN
500.0000 mg | INTRAVENOUS | Status: DC
  Administered 2014-01-17 – 2014-01-19 (×3): 500 mg via INTRAVENOUS
  Filled 2014-01-17 (×3): qty 100

## 2014-01-17 MED ORDER — HYDROMORPHONE HCL 1 MG/ML IJ SOLN
1.0000 mg | Freq: Once | INTRAMUSCULAR | Status: AC
Start: 2014-01-17 — End: 2014-01-17
  Administered 2014-01-17: 1 mg via INTRAVENOUS
  Filled 2014-01-17: qty 1

## 2014-01-17 MED ORDER — IOHEXOL 300 MG/ML  SOLN
100.0000 mL | Freq: Once | INTRAMUSCULAR | Status: AC | PRN
  Administered 2014-01-17: 100 mL via INTRAVENOUS

## 2014-01-17 MED ORDER — ONDANSETRON HCL 4 MG/2ML IJ SOLN
4.0000 mg | Freq: Four times a day (QID) | INTRAMUSCULAR | Status: DC | PRN
  Administered 2014-01-17 – 2014-01-20 (×3): 4 mg via INTRAVENOUS
  Filled 2014-01-17 (×3): qty 2

## 2014-01-17 MED ORDER — PNEUMOCOCCAL VAC POLYVALENT 25 MCG/0.5ML IJ INJ
0.5000 mL | INJECTION | INTRAMUSCULAR | Status: DC
  Filled 2014-01-17: qty 0.5

## 2014-01-17 MED ORDER — INFLUENZA VAC SPLIT QUAD 0.5 ML IM SUSY: 0.5000 mL | PREFILLED_SYRINGE | INTRAMUSCULAR | Status: DC

## 2014-01-17 NOTE — ED Notes (Signed)
Pt to CT

## 2014-01-17 NOTE — MAU Note (Signed)
Spoke with Venus Standard CNM & RN on [facility]; pt is to be direct admit to [facility]. Pt transferred to rm [id number].

## 2014-01-17 NOTE — ED Notes (Addendum)
Pt c/o central chest pain, radiating into her back, worse with movement.  "I think it's from laying down."  Pt assisted to reposition.  EKG obtained.  NAD.

## 2014-01-17 NOTE — ED Notes (Addendum)
Initial Contact - pt A+ox4, reports novasure ablation procedure done x3 weeks ago and has had diffuse abd pain since with n/v.  Pt denies fevers/chills.  Pt reports has been seen in EDs for same since "they just give me pain medicine which makes me go to sleep and then I still have pain".  Skin PWD.  MAEI.  Self repositioning for comfort.  Pt aware of need for urine sample, sts unable to provide at this time. NAD.

## 2014-01-17 NOTE — MAU Note (Signed)
[facility]  Reports went to hospital for lower abdominal pain.

## 2014-01-17 NOTE — H&P (Signed)
Admission History and Physical Exam for a Gynecology Patient  Ms. Zachery DauerKia J Kil is a 33 y.o. female, [id number], who presents for intravenous antibiotics.  On January 01, 2012 the patient had a NovaSure ablation of the endometrium.  Hysteroscopy confirmed that there was no perforation.  The patient has had abdominal discomfort since the time of her surgery.  An ultrasound was performed and no masses were appreciated.  The patient has been treated with antibiotics including ciprofloxacin, and doxycycline.  She was noted to have a urinary tract infection postoperatively.  She was seen at the emergency department [facility] at [facility]. The patient complained of increasing abdominal pain. Her white blood cell count was noted to be elevated.  Her pulse was 120 bpm. The decision was made to admit the patient for intravenous antibiotic therapy. [facility].  OB History    Gravida Para Term Preterm AB TAB SAB Ectopic Multiple Living   7 2  2 5  5   2       Past Medical History  Diagnosis Date  . Diabetes     Prescriptions prior to admission  Medication Sig Dispense Refill Last Dose  . doxycycline (VIBRA-TABS) 100 MG tablet Take 100 mg by mouth 2 (two) times daily. For 10 days   01/17/2014 at Unknown time  . ibuprofen (ADVIL,MOTRIN) 600 MG tablet Take 600 mg by mouth every 6 (six) hours as needed for moderate pain.   01/17/2014 at Unknown time  . metFORMIN (GLUCOPHAGE) 500 MG tablet Take 1 tablet (500 mg total) by mouth 2 (two) times daily with a meal. 60 tablet 0 01/13/2014  . naproxen sodium (ANAPROX) 220 MG tablet Take 440 mg by mouth 2 (two) times daily with a meal.   01/17/2014 at Unknown time  . oxyCODONE-acetaminophen (ROXICET) 5-325 MG per tablet Take 1-2 tablets by mouth every 4 (four) hours as needed. 36 tablet 0 01/16/2014 at Unknown time  . ciprofloxacin (CIPRO) 250 MG tablet Take 250  mg by mouth 2 (two) times daily.   Completed Course at Unknown time  . meclizine (ANTIVERT) 50 MG tablet Take 1 tablet (50 mg total) by mouth 3 (three) times daily as needed. For dizziness (Patient not taking: Reported on 01/13/2014) 30 tablet 0     Past Surgical History  Procedure Laterality Date  . Tubal ligation    . Cesarean section      No Known Allergies  Family History: family history is not on file.  Social History:  reports that she has never smoked. She does not have any smokeless tobacco history on file. She reports that she does not drink alcohol or use illicit drugs.  Review of systems: See HPI.  Admission Physical Exam:    There is no weight on file to calculate BMI.  Blood pressure 122/81, pulse 116, temperature 99.7 F (37.6 C), temperature source Oral, resp. rate 20, last menstrual period 12/31/2013, SpO2 95 %, unknown if currently breastfeeding.  HEENT:                 Within normal limits Chest:                   Clear Heart:                    Regular rate and rhythm Breasts:  No masses, skin changes, bleeding, or discharge present Abdomen:             Nontender, no masses Extremities:          Grossly normal Neurologic exam: Grossly normal  Pelvic exam:  External genitalia: normal general appearance Vaginal: normal without tenderness, induration or masses Cervix: normal appearance Adnexa: normal bimanual exam and tender Uterus: normal size, shape, and consistency.  Tender.  Exam by another provider.  Assessment:  Status post NovaSure ablation of the endometrium on December 31, 2013.  Elevated white blood cell count.  Presumed endometritis  Obesity  Diabetes  Plan:  We will admit the patient for intravenous antibiotic therapy. We will treat the patient with Levaquin and metronidazole.  Repeat white blood cell count tomorrow morning.  Monitor blood sugars.   Janine LimboSTRINGER,Kylan Liberati V 01/17/2014

## 2014-01-17 NOTE — ED Notes (Signed)
Hold on levaquin at this time, [facility]

## 2014-01-17 NOTE — ED Provider Notes (Signed)
CSN: [id number]     Arrival date & time 01/17/14  1326 History   First MD Initiated Contact with Patient 01/17/14 1459     Chief Complaint  Patient presents with  . Abdominal Pain     (Consider location/radiation/quality/duration/timing/severity/associated sxs/prior Treatment) HPI Patient presents to the emergency department with abdominal pain that has been ongoing over the last 3 weeks.  The patient states that she had an endometrial ablation performed due to heavy menstrual bleeding.  Patient states that since that time she has been having lower to right sided abdominal pain that is radiating to her back.  The patient states that [facility] and by her GYN doctor.  The patient states she was placed on doxycycline for an infection, but she cannot elaborate on what type of infection.  They were treating.  The patient states that movement and palpation make the pain worse.  She states that she has been on oral pain medications at home without relief of her symptoms.  Patient denies chest pain, vomiting, shortness of breath, headache, blurred vision, weakness, dizziness, fever, lightheadedness, near syncope or syncope.  The patient states that Past Medical History  Diagnosis Date  . Diabetes    Past Surgical History  Procedure Laterality Date  . Tubal ligation    . Cesarean section     History reviewed. No pertinent family history. History  Substance Use Topics  . Smoking status: Never Smoker   . Smokeless tobacco: Not on file  . Alcohol Use: No   OB History    Gravida Para Term Preterm AB TAB SAB Ectopic Multiple Living   7 2  2 5  5   2      Review of Systems    Allergies  Review of patient's allergies indicates no known allergies.  Home Medications   Prior to Admission medications   Medication Sig Start Date End Date Taking? Authorizing Provider  doxycycline (VIBRA-TABS) 100 MG tablet Take 100 mg by mouth 2 (two) times daily. For 10 days   Yes  Historical Provider, MD  ibuprofen (ADVIL,MOTRIN) 600 MG tablet Take 600 mg by mouth every 6 (six) hours as needed for moderate pain.   Yes Historical Provider, MD  metFORMIN (GLUCOPHAGE) 500 MG tablet Take 1 tablet (500 mg total) by mouth 2 (two) times daily with a meal. 07/20/13  Yes Linna HoffJames D Kindl, MD  naproxen sodium (ANAPROX) 220 MG tablet Take 440 mg by mouth 2 (two) times daily with a meal.   Yes Historical Provider, MD  oxyCODONE-acetaminophen (ROXICET) 5-325 MG per tablet Take 1-2 tablets by mouth every 4 (four) hours as needed. 01/13/14 01/13/15 Yes Nigel BridgemanVicki Latham, CNM  ciprofloxacin (CIPRO) 250 MG tablet Take 250 mg by mouth 2 (two) times daily.    Historical Provider, MD  meclizine (ANTIVERT) 50 MG tablet Take 1 tablet (50 mg total) by mouth 3 (three) times daily as needed. For dizziness Patient not taking: Reported on 01/13/2014 07/20/13   Linna HoffJames D Kindl, MD   BP 112/65 mmHg  Pulse 116  Temp(Src) 97.8 F (36.6 C) (Oral)  Resp 23  SpO2 94%  LMP 12/31/2013 Physical Exam  Constitutional: She is oriented to person, place, and time. She appears well-developed and well-nourished. No distress.  HENT:  Head: Normocephalic and atraumatic.  Mouth/Throat: Oropharynx is clear and moist.  Eyes: Pupils are equal, round, and reactive to light.  Neck: Normal range of motion. Neck supple.  Cardiovascular: Normal rate, regular rhythm and normal heart  sounds.  Exam reveals no gallop and no friction rub.   No murmur heard. Pulmonary/Chest: Effort normal and breath sounds normal. No respiratory distress.  Abdominal: Soft. Bowel sounds are normal. She exhibits no distension. There is tenderness. There is no rebound and no guarding.  Neurological: She is alert and oriented to person, place, and time. She exhibits normal muscle tone. Coordination normal.  Skin: Skin is warm and dry. No rash noted. No erythema.  Nursing note and vitals reviewed.   ED Course  Procedures (including critical care  time) Labs Review Labs Reviewed  CBC WITH DIFFERENTIAL - Abnormal; Notable for the following:    WBC 15.5 (*)    Hemoglobin 11.1 (*)    HCT 35.2 (*)    MCH 25.9 (*)    Neutrophils Relative % 84 (*)    Neutro Abs 13.1 (*)    All other components within normal limits  COMPREHENSIVE METABOLIC PANEL - Abnormal; Notable for the following:    Sodium 136 (*)    Chloride 94 (*)    Glucose, Bld 207 (*)    All other components within normal limits  URINE CULTURE  LIPASE, BLOOD  PREGNANCY, URINE  URINALYSIS, ROUTINE W REFLEX MICROSCOPIC    Imaging Review Ct Abdomen Pelvis W Contrast  01/17/2014   CLINICAL DATA:  Abdominal pain with nausea and vomiting. Endometrial ablation procedures 3 weeks prior  EXAM: CT ABDOMEN AND PELVIS WITH CONTRAST  TECHNIQUE: Multidetector CT imaging of the abdomen and pelvis was performed using the standard protocol following bolus administration of intravenous contrast. Oral contrast was also administered.  CONTRAST:  50mL OMNIPAQUE IOHEXOL 300 MG/ML SOLN, OMNIPAQUE IOHEXOL 300 MG/ML SOLN  COMPARISON:  Pelvic ultrasound January 13, 2014  FINDINGS: Lung bases are clear.  Liver is enlarged, measuring 21.5 cm in length. No focal liver lesions are identified. The gallbladder wall does not appear appreciably thickened. There is no biliary duct dilatation.  Spleen, pancreas, and adrenals appear normal. Kidneys bilaterally show no mass or hydronephrosis on either side. There is no renal or ureteral calculus on either side.  In the pelvis, the urinary bladder is midline. Urinary bladder is partially decompressed. The urinary bladder wall thickness is felt to be within normal limits given the partial decompression.  The endometrium measures 21 mm in thickness, thickened. No air is appreciable the appendix region appears normal. There is no periappendiceal region inflammatory change. There is a small ventral hernia containing only fat.  There is no bowel obstruction. There is  no free air or portal venous air. There is no appreciable adenopathy. A few small retroperitoneal lymph nodes do not meet size criteria for pathologic significance. There is no abscess or ascites in the abdomen or pelvis. There is no demonstrable abdominal aortic aneurysm. There are no blastic or lytic bone lesions.  IMPRESSION: The endometrium appears rather prominent by CT, but there is no well-defined mass or air in the endometrium. Pelvic ultrasound advised for more precise assessment of the endometrium, particularly given the clinical history.  There is no mass in the pelvis. There is no bowel obstruction. There is no periappendiceal region inflammation. No abscess.  There is a small ventral hernia containing only fat.  Liver enlarged without focal lesion.   Electronically Signed   By: Bretta Bang M.D.   On: 01/17/2014 17:59   I spoke with Dr. Estanislado Pandy, and Dr. Stefano Gaul who [facility].  The patient would like to go Private vehicle  straight over to women[facility] hospital rather than by [facility].  [facility]    Carlyle DollyChristopher W Elainah Rhyne, PA-C 01/17/14 1919  Elwin MochaBlair Walden, MD 01/17/14 2350

## 2014-01-17 NOTE — ED Notes (Signed)
Pt continues to reports "pressure", but reports abd pain much improved.  Ambulatory with steady gait to void in BR.

## 2014-01-17 NOTE — ED Notes (Signed)
Per ems pt c/o abdominal pain x1 week, pain increased today. Denies nausea/vomiting. Last bowel movement last night.   Upon rn assessment pain 10/10. Pt had a procedure on 12/4 to burn off lining of uterus.

## 2014-01-18 LAB — CBC WITH DIFFERENTIAL/PLATELET
BASOS ABS: 0 10*3/uL (ref 0.0–0.1)
Basophils Relative: 0 % (ref 0–1)
Eosinophils Absolute: 0 10*3/uL (ref 0.0–0.7)
Eosinophils Relative: 0 % (ref 0–5)
HEMATOCRIT: 31.7 % — AB (ref 36.0–46.0)
Hemoglobin: 10.1 g/dL — ABNORMAL LOW (ref 12.0–15.0)
Lymphocytes Relative: 10 % — ABNORMAL LOW (ref 12–46)
Lymphs Abs: 1.2 10*3/uL (ref 0.7–4.0)
MCH: 25.7 pg — ABNORMAL LOW (ref 26.0–34.0)
MCHC: 31.9 g/dL (ref 30.0–36.0)
MCV: 80.7 fL (ref 78.0–100.0)
Monocytes Absolute: 0.8 10*3/uL (ref 0.1–1.0)
Monocytes Relative: 6 % (ref 3–12)
NEUTROS ABS: 10.8 10*3/uL — AB (ref 1.7–7.7)
Neutrophils Relative %: 84 % — ABNORMAL HIGH (ref 43–77)
PLATELETS: 298 10*3/uL (ref 150–400)
RBC: 3.93 MIL/uL (ref 3.87–5.11)
RDW: 15.2 % (ref 11.5–15.5)
WBC: 12.9 10*3/uL — AB (ref 4.0–10.5)

## 2014-01-18 LAB — GLUCOSE, CAPILLARY
Glucose-Capillary: 188 mg/dL — ABNORMAL HIGH (ref 70–99)
Glucose-Capillary: 320 mg/dL — ABNORMAL HIGH (ref 70–99)
Glucose-Capillary: 162 mg/dL — ABNORMAL HIGH (ref 70–99)
Glucose-Capillary: 188 mg/dL — ABNORMAL HIGH (ref 70–99)

## 2014-01-18 LAB — GLUCOSE, RANDOM: Glucose, Bld: 214 mg/dL — ABNORMAL HIGH (ref 70–99)

## 2014-01-18 MED ORDER — IBUPROFEN 800 MG PO TABS
800.0000 mg | ORAL_TABLET | Freq: Three times a day (TID) | ORAL | Status: DC | PRN
  Administered 2014-01-18 (×2): 800 mg via ORAL
  Filled 2014-01-18 (×2): qty 1

## 2014-01-18 MED ORDER — MORPHINE SULFATE 4 MG/ML IJ SOLN
2.0000 mg | INTRAMUSCULAR | Status: DC | PRN
  Administered 2014-01-18 (×2): 2 mg via INTRAVENOUS
  Filled 2014-01-18 (×2): qty 1

## 2014-01-18 MED ORDER — NAPROXEN SODIUM 550 MG PO TABS
550.0000 mg | ORAL_TABLET | Freq: Two times a day (BID) | ORAL | Status: DC
  Administered 2014-01-18 – 2014-01-25 (×11): 550 mg via ORAL
  Filled 2014-01-18 (×18): qty 1

## 2014-01-18 MED ORDER — POLYETHYLENE GLYCOL 3350 17 G PO PACK
17.0000 g | PACK | Freq: Every day | ORAL | Status: DC
  Administered 2014-01-18 – 2014-01-19 (×2): 17 g via ORAL
  Filled 2014-01-18 (×2): qty 1

## 2014-01-18 MED ORDER — HYDROMORPHONE HCL 2 MG PO TABS
2.0000 mg | ORAL_TABLET | ORAL | Status: DC | PRN
  Administered 2014-01-18 – 2014-01-21 (×6): 2 mg via ORAL
  Filled 2014-01-18 (×6): qty 1

## 2014-01-18 MED ORDER — METFORMIN HCL 500 MG PO TABS
500.0000 mg | ORAL_TABLET | Freq: Two times a day (BID) | ORAL | Status: DC
  Administered 2014-01-18 – 2014-01-19 (×4): 500 mg via ORAL
  Filled 2014-01-18 (×7): qty 1

## 2014-01-18 MED ORDER — DOCUSATE SODIUM 100 MG PO CAPS
200.0000 mg | ORAL_CAPSULE | Freq: Two times a day (BID) | ORAL | Status: DC
  Administered 2014-01-18 – 2014-01-21 (×6): 200 mg via ORAL
  Filled 2014-01-18 (×8): qty 2

## 2014-01-18 NOTE — Progress Notes (Signed)
   01/18/14 1800  Clinical Encounter Type  Visited With Patient and family together (husband Loma Sousa)  Visit Type Spiritual support;Social support  Referral From Nurse Vonzella Nipple, RN)  Spiritual Encounters  Spiritual Needs Emotional  Stress Factors  Patient Stress Factors Health changes;Loss of control   Met with Rosangelica and family to introduce Spiritual Care and chaplain availability for support as she navigates pain and health questions.  Provided pastoral presence, reflective listening, and encouragement.  Pt appreciative of opportunity to share and process her story.  Family aware of ongoing chaplain availability, but please also page as needs arise.  Thank you.  [place], [place]

## 2014-01-18 NOTE — Progress Notes (Signed)
Inpatient Diabetes Program Recommendations  AACE/ADA: New Consensus Statement on Inpatient Glycemic Control (2013)  Target Ranges:  Prepandial:   less than 140 mg/dL      Peak postprandial:   less than 180 mg/dL (1-2 hours)      Critically ill patients:  140 - 180 mg/dL   Results for Nicole DauerMCCUTCHEN, Alesa J (MRN [id number]) as of 01/18/2014 07:48  Ref. Range 01/17/2014 13:44 01/18/2014 05:20  Glucose Latest Range: 70-99 mg/dL 657207 (H) 846214 (H)   Diabetes history: DM2 Outpatient Diabetes medications: Metformin 500 mg BID Current orders for Inpatient glycemic control: Metformin 500 mg BID  Inpatient Diabetes Program Recommendations Correction (SSI): While inpatient, please consider ordering CBGs with Novolog correction scale ACHS. HgbA1C: Please consider ordering an A1C to evaluate glycemic control over the past 2-3 months.  Thanks, Orlando PennerMarie Malisha Mabey, RN, MSN, CCRN, CDE Diabetes Coordinator Inpatient Diabetes Program [phone] (Team Pager) [phone] (AP office) [phone] [facility] office)

## 2014-01-18 NOTE — Progress Notes (Addendum)
Subjective: Patient reports tolerating PO.  She still has some abdominal pain  Objective: I have reviewed patient's vital signs.  General: alert and cooperative Resp: clear to auscultation bilaterally Cardio: regular rate and rhythm GI: soft, non-tender; bowel sounds normal; no masses,  no organomegaly Extremities: extremities normal, atraumatic, no cyanosis or edema Vaginal Bleeding: minimal Recent Results (from the past 2160 hour(s))  GC/Chlamydia Probe Amp     Status: None   Collection Time: 01/13/14 10:50 AM  Result Value Ref Range   CT Probe RNA NEGATIVE NEGATIVE   GC Probe RNA NEGATIVE NEGATIVE    Comment: (NOTE)                                                                                       **Normal Reference Range: Negative**      Assay performed using the Gen-Probe APTIMA COMBO2 (R) Assay. Acceptable specimen types for this assay include APTIMA Swabs (Unisex, endocervical, urethral, or vaginal), first void urine, and ThinPrep liquid based cytology samples. Performed at [facility]   Urinalysis, Routine w reflex microscopic     Status: Abnormal   Collection Time: 01/13/14 11:06 AM  Result Value Ref Range   Color, Urine YELLOW YELLOW   APPearance HAZY (A) CLEAR   Specific Gravity, Urine >1.030 (H) 1.005 - 1.030   pH 5.5 5.0 - 8.0   Glucose, UA NEGATIVE NEGATIVE mg/dL   Hgb urine dipstick LARGE (A) NEGATIVE   Bilirubin Urine NEGATIVE NEGATIVE   Ketones, ur NEGATIVE NEGATIVE mg/dL   Protein, ur NEGATIVE NEGATIVE mg/dL   Urobilinogen, UA 0.2 0.0 - 1.0 mg/dL   Nitrite NEGATIVE NEGATIVE   Leukocytes, UA TRACE (A) NEGATIVE  Urine microscopic-add on     Status: Abnormal   Collection Time: 01/13/14 11:06 AM  Result Value Ref Range   Squamous Epithelial / LPF FEW (A) RARE   WBC, UA 3-6 <3 WBC/hpf   RBC / HPF 21-50 <3 RBC/hpf  Pregnancy, urine POC     Status: None   Collection Time: 01/13/14 11:13 AM  Result Value Ref Range   Preg Test, Ur NEGATIVE  NEGATIVE    Comment:        THE SENSITIVITY OF THIS METHODOLOGY IS >24 mIU/mL   CBC with Differential     Status: Abnormal   Collection Time: 01/13/14 12:00 PM  Result Value Ref Range   WBC 11.5 (H) 4.0 - 10.5 K/uL   RBC 4.19 3.87 - 5.11 MIL/uL   Hemoglobin 11.0 (L) 12.0 - 15.0 g/dL   HCT 34.4 (L) 36.0 - 46.0 %   MCV 82.1 78.0 - 100.0 fL   MCH 26.3 26.0 - 34.0 pg   MCHC 32.0 30.0 - 36.0 g/dL   RDW 15.3 11.5 - 15.5 %   Platelets 329 150 - 400 K/uL   Neutrophils Relative % 76 43 - 77 %   Neutro Abs 8.6 (H) 1.7 - 7.7 K/uL   Lymphocytes Relative 18 12 - 46 %   Lymphs Abs 2.1 0.7 - 4.0 K/uL   Monocytes Relative 6 3 - 12 %   Monocytes Absolute 0.7 0.1 - 1.0 K/uL   Eosinophils Relative  0 0 - 5 %   Eosinophils Absolute 0.1 0.0 - 0.7 K/uL   Basophils Relative 0 0 - 1 %   Basophils Absolute 0.0 0.0 - 0.1 K/uL  Comprehensive metabolic panel     Status: Abnormal   Collection Time: 01/13/14 12:00 PM  Result Value Ref Range   Sodium 140 137 - 147 mEq/L   Potassium 4.1 3.7 - 5.3 mEq/L   Chloride 100 96 - 112 mEq/L   CO2 25 19 - 32 mEq/L   Glucose, Bld 253 (H) 70 - 99 mg/dL   BUN 12 6 - 23 mg/dL   Creatinine, Ser 0.65 0.50 - 1.10 mg/dL   Calcium 9.2 8.4 - 10.5 mg/dL   Total Protein 7.6 6.0 - 8.3 g/dL   Albumin 3.3 (L) 3.5 - 5.2 g/dL   AST 13 0 - 37 U/L   ALT 12 0 - 35 U/L   Alkaline Phosphatase 110 39 - 117 U/L   Total Bilirubin 0.3 0.3 - 1.2 mg/dL   GFR calc non Af Amer >90 >90 mL/min   GFR calc Af Amer >90 >90 mL/min    Comment: (NOTE) The eGFR has been calculated using the CKD EPI equation. This calculation has not been validated in all clinical situations. eGFR's persistently <90 mL/min signify possible Chronic Kidney Disease.    Anion gap 15 5 - 15    Comment: Performed at [facility]  CBC with Differential     Status: Abnormal   Collection Time: 01/17/14  1:44 PM  Result Value Ref Range   WBC 15.5 (H) 4.0 - 10.5 K/uL   RBC 4.29 3.87 - 5.11 MIL/uL   Hemoglobin  11.1 (L) 12.0 - 15.0 g/dL   HCT 35.2 (L) 36.0 - 46.0 %   MCV 82.1 78.0 - 100.0 fL   MCH 25.9 (L) 26.0 - 34.0 pg   MCHC 31.5 30.0 - 36.0 g/dL   RDW 15.1 11.5 - 15.5 %   Platelets 338 150 - 400 K/uL   Neutrophils Relative % 84 (H) 43 - 77 %   Neutro Abs 13.1 (H) 1.7 - 7.7 K/uL   Lymphocytes Relative 12 12 - 46 %   Lymphs Abs 1.8 0.7 - 4.0 K/uL   Monocytes Relative 4 3 - 12 %   Monocytes Absolute 0.6 0.1 - 1.0 K/uL   Eosinophils Relative 0 0 - 5 %   Eosinophils Absolute 0.0 0.0 - 0.7 K/uL   Basophils Relative 0 0 - 1 %   Basophils Absolute 0.0 0.0 - 0.1 K/uL  Lipase, blood     Status: None   Collection Time: 01/17/14  1:44 PM  Result Value Ref Range   Lipase 26 11 - 59 U/L  Comprehensive metabolic panel     Status: Abnormal   Collection Time: 01/17/14  1:44 PM  Result Value Ref Range   Sodium 136 (L) 137 - 147 mEq/L   Potassium 3.9 3.7 - 5.3 mEq/L   Chloride 94 (L) 96 - 112 mEq/L   CO2 28 19 - 32 mEq/L   Glucose, Bld 207 (H) 70 - 99 mg/dL   BUN 13 6 - 23 mg/dL   Creatinine, Ser 0.71 0.50 - 1.10 mg/dL   Calcium 9.0 8.4 - 10.5 mg/dL   Total Protein 8.3 6.0 - 8.3 g/dL   Albumin 3.8 3.5 - 5.2 g/dL   AST 14 0 - 37 U/L   ALT 14 0 - 35 U/L   Alkaline Phosphatase 117 39 - 117  U/L   Total Bilirubin 0.5 0.3 - 1.2 mg/dL   GFR calc non Af Amer >90 >90 mL/min   GFR calc Af Amer >90 >90 mL/min    Comment: (NOTE) The eGFR has been calculated using the CKD EPI equation. This calculation has not been validated in all clinical situations. eGFR's persistently <90 mL/min signify possible Chronic Kidney Disease.    Anion gap 14 5 - 15  Urinalysis, Routine w reflex microscopic     Status: Abnormal   Collection Time: 01/17/14  5:17 PM  Result Value Ref Range   Color, Urine YELLOW YELLOW   APPearance CLOUDY (A) CLEAR   Specific Gravity, Urine 1.028 1.005 - 1.030   pH 5.5 5.0 - 8.0   Glucose, UA NEGATIVE NEGATIVE mg/dL   Hgb urine dipstick LARGE (A) NEGATIVE   Bilirubin Urine NEGATIVE  NEGATIVE   Ketones, ur NEGATIVE NEGATIVE mg/dL   Protein, ur 30 (A) NEGATIVE mg/dL   Urobilinogen, UA 1.0 0.0 - 1.0 mg/dL   Nitrite NEGATIVE NEGATIVE   Leukocytes, UA MODERATE (A) NEGATIVE  Pregnancy, urine     Status: None   Collection Time: 01/17/14  5:17 PM  Result Value Ref Range   Preg Test, Ur NEGATIVE NEGATIVE    Comment:        THE SENSITIVITY OF THIS METHODOLOGY IS >20 mIU/mL.   Urine microscopic-add on     Status: Abnormal   Collection Time: 01/17/14  5:17 PM  Result Value Ref Range   Squamous Epithelial / LPF RARE RARE   WBC, UA 11-20 <3 WBC/hpf   RBC / HPF 11-20 <3 RBC/hpf   Bacteria, UA FEW (A) RARE   Urine-Other MUCOUS PRESENT   CBC WITH DIFFERENTIAL     Status: Abnormal   Collection Time: 01/18/14  5:20 AM  Result Value Ref Range   WBC 12.9 (H) 4.0 - 10.5 K/uL   RBC 3.93 3.87 - 5.11 MIL/uL   Hemoglobin 10.1 (L) 12.0 - 15.0 g/dL   HCT 31.7 (L) 36.0 - 46.0 %   MCV 80.7 78.0 - 100.0 fL   MCH 25.7 (L) 26.0 - 34.0 pg   MCHC 31.9 30.0 - 36.0 g/dL   RDW 15.2 11.5 - 15.5 %   Platelets 298 150 - 400 K/uL   Neutrophils Relative % 84 (H) 43 - 77 %   Neutro Abs 10.8 (H) 1.7 - 7.7 K/uL   Lymphocytes Relative 10 (L) 12 - 46 %   Lymphs Abs 1.2 0.7 - 4.0 K/uL   Monocytes Relative 6 3 - 12 %   Monocytes Absolute 0.8 0.1 - 1.0 K/uL   Eosinophils Relative 0 0 - 5 %   Eosinophils Absolute 0.0 0.0 - 0.7 K/uL   Basophils Relative 0 0 - 1 %   Basophils Absolute 0.0 0.0 - 0.1 K/uL  Glucose, random     Status: Abnormal   Collection Time: 01/18/14  5:20 AM  Result Value Ref Range   Glucose, Bld 214 (H) 70 - 99 mg/dL  Glucose, capillary     Status: Abnormal   Collection Time: 01/18/14  7:35 AM  Result Value Ref Range   Glucose-Capillary 188 (H) 70 - 99 mg/dL     Assessment/Plan: S/p Novasure with abdominal pain and endometritis Continue IV abx Pt afebrile monittor blood sugars fasting was elevated today Check cbc in the morning   LOS: 1 day    Nicole Cherry,Nicole Cherry  A 01/18/2014, 12:15 PM    Seen and agreed Reviewed findings with patient,  mother of the patient and husband: c/w endometritis WBC is improving and VS are normal ( tachycardia resolved) Will repeat IV Levaquin tonight and try to optimize pain management: Naproxen and Dilaudid ordered. Constipation may be responsible for upper abdominal discomfort: Colace and Miralax ordered Expecting D/C home tomorrow with another 10 days of Levaquin Will plan follow-up in the office 01/26/14 at 8:00 Patient is reassured

## 2014-01-19 LAB — CBC WITH DIFFERENTIAL/PLATELET
BASOS ABS: 0 10*3/uL (ref 0.0–0.1)
BASOS PCT: 0 % (ref 0–1)
EOS PCT: 0 % (ref 0–5)
Eosinophils Absolute: 0.1 10*3/uL (ref 0.0–0.7)
HEMATOCRIT: 32.1 % — AB (ref 36.0–46.0)
Hemoglobin: 10.1 g/dL — ABNORMAL LOW (ref 12.0–15.0)
Lymphocytes Relative: 12 % (ref 12–46)
Lymphs Abs: 1.5 10*3/uL (ref 0.7–4.0)
MCH: 25.7 pg — ABNORMAL LOW (ref 26.0–34.0)
MCHC: 31.5 g/dL (ref 30.0–36.0)
MCV: 81.7 fL (ref 78.0–100.0)
MONO ABS: 0.8 10*3/uL (ref 0.1–1.0)
Monocytes Relative: 6 % (ref 3–12)
NEUTROS ABS: 10.2 10*3/uL — AB (ref 1.7–7.7)
Neutrophils Relative %: 82 % — ABNORMAL HIGH (ref 43–77)
Platelets: 301 10*3/uL (ref 150–400)
RBC: 3.93 MIL/uL (ref 3.87–5.11)
RDW: 15.1 % (ref 11.5–15.5)
WBC: 12.5 10*3/uL — ABNORMAL HIGH (ref 4.0–10.5)

## 2014-01-19 LAB — URINE CULTURE: Colony Count: 80000

## 2014-01-19 LAB — GLUCOSE, CAPILLARY: Glucose-Capillary: 155 mg/dL — ABNORMAL HIGH (ref 70–99)

## 2014-01-19 LAB — GLUCOSE, RANDOM: GLUCOSE: 178 mg/dL — AB (ref 70–99)

## 2014-01-19 NOTE — Progress Notes (Signed)
Ms Nicole Cherry is eager to be feeling better.  She relayed some of her story to me and her hopes for being home with her family for Christmas and not feeling as much pain. This process has been frustrating for her and for her family.  I provided pastoral presence and listening presence as she told her story.  She is aware of on-going availability of chaplain, please page as needs arise.  [facility] CorporationChaplain Katy Shanecia Hoganson Pager, [phone] 4:05 PM    01/19/14 1600  Clinical Encounter Type  Visited With Patient  Visit Type Spiritual support  Referral From Nurse  Spiritual Encounters  Spiritual Needs Emotional

## 2014-01-19 NOTE — Progress Notes (Signed)
Inpatient Diabetes Program Recommendations  AACE/ADA: New Consensus Statement on Inpatient Glycemic Control (2013)  Target Ranges:  Prepandial:   less than 140 mg/dL      Peak postprandial:   less than 180 mg/dL (1-2 hours)      Critically ill patients:  140 - 180 mg/dL   Results for Nicole DauerMCCUTCHEN, [name] (MRN [id number]) as of 01/19/2014 10:28  Ref. Range 01/18/2014 07:35 01/18/2014 12:45 01/18/2014 17:01 01/18/2014 21:18 01/19/2014 05:31  Glucose-Capillary Latest Range: 70-99 mg/dL 409188 (H) 811320 (H) 914162 (H) 188 (H) 155 (H)   Diabetes history: DM2 Outpatient Diabetes medications: Metformin 500 mg BID Current orders for Inpatient glycemic control: Metformin 500 mg BID  Inpatient Diabetes Program Recommendations Correction (SSI): While inpatient, please consider ordering CBGs with Novolog sensitive correction scale ACHS. HgbA1C: Please consider ordering an A1C to evaluate glycemic control over the past 2-3 months.  Thanks, Orlando PennerMarie Rylynn Schoneman, RN, MSN, CCRN, CDE Diabetes Coordinator Inpatient Diabetes Program [phone] (Team Pager) [phone] ([place] office) [phone] [facility] office)

## 2014-01-19 NOTE — Progress Notes (Signed)
[id number] pt stated she was feeling chilled. Dr. Jeanene Erballed. No new orders. Temp at 1900 was 100.

## 2014-01-19 NOTE — Progress Notes (Signed)
Patient ID: Zachery DauerKia J Camero, female   DOB: 1980/07/30, 33 y.o.   MRN: [id number]  Hospital Day # 3.   S. Pt. Reports continued lower abdominal pain, same intensity as when she was admitted, but now radiating to right chest area.  8 out of 10 intensity. Last Dilaudid was taken at 4 PM. Also with constipation, feels gassy. Denies fevers or chills. With small vaginal bleeding and foul vaginal odor.   Tawana Scale. Filed Vitals:   01/19/14 0524 01/19/14 1000 01/19/14 1400 01/19/14 1747  BP: 112/73 90/44 136/73 132/67  Pulse: 93 98 106 111  Temp: 98.6 F (37 C) 98.5 F (36.9 C) 99.2 F (37.3 C) 98.3 F (36.8 C)  TempSrc: Oral Oral Oral   Resp: 18 18 24 20   Height:      Weight: 264 lb (119.75 kg)     SpO2: 100% 98% 100% 100%   Gen: Moderate distress complaining of abdominal pain.  CVS: S1, S2, RR, tachy at 105 Pulm: CTAB ABD; S/obese/tender to palpation diffusely in right and left lower and mid abdomen levels, no rebound, no guarding. + BS. Lower Extremities: Warm and well perfused, no calf tenderness, no erythema.  CBC Latest Ref Rng 01/19/2014 01/18/2014 01/17/2014  WBC 4.0 - 10.5 K/uL 12.5(H) 12.9(H) 15.5(H)  Hemoglobin 12.0 - 15.0 g/dL 10.1(L) 10.1(L) 11.1(L)  Hematocrit 36.0 - 46.0 % 32.1(L) 31.7(L) 35.2(L)  Platelets 150 - 400 K/uL 301 298 338   CMP     Component Value Date/Time   NA 136* 01/17/2014 1344   K 3.9 01/17/2014 1344   CL 94* 01/17/2014 1344   CO2 28 01/17/2014 1344   GLUCOSE 178* 01/19/2014 0529   BUN 13 01/17/2014 1344   CREATININE 0.71 01/17/2014 1344   CALCIUM 9.0 01/17/2014 1344   PROT 8.3 01/17/2014 1344   ALBUMIN 3.8 01/17/2014 1344   AST 14 01/17/2014 1344   ALT 14 01/17/2014 1344   ALKPHOS 117 01/17/2014 1344   BILITOT 0.5 01/17/2014 1344   GFRNONAA >90 01/17/2014 1344   GFRAA >90 01/17/2014 1344   FSG: 207/214/178  A/P: 33 Y/O Admitted with abdominal pain secondary to endometritis, with recent history of Novasure endometrial ablation, with continued  abdominal pain,  -Continue with IV antibiotics. -Advised more frequent dilaudid use (ordered q 3 hrs, patient taking q 12 hrs), also naproxen as ordered.  -mylicon for gas pain. -colace for constipation.  -c/w metformin.  -Reasses in AM and likely discharge to home if clincally improved. CBC in AM.    Unit floor time 30 minutes.

## 2014-01-20 DIAGNOSIS — R1011 Right upper quadrant pain: Secondary | ICD-10-CM

## 2014-01-20 DIAGNOSIS — R509 Fever, unspecified: Secondary | ICD-10-CM

## 2014-01-20 DIAGNOSIS — D72829 Elevated white blood cell count, unspecified: Secondary | ICD-10-CM

## 2014-01-20 LAB — CBC WITH DIFFERENTIAL/PLATELET
BASOS PCT: 0 % (ref 0–1)
Basophils Absolute: 0 10*3/uL (ref 0.0–0.1)
Eosinophils Absolute: 0 10*3/uL (ref 0.0–0.7)
Eosinophils Relative: 0 % (ref 0–5)
HCT: 32 % — ABNORMAL LOW (ref 36.0–46.0)
HEMOGLOBIN: 10.3 g/dL — AB (ref 12.0–15.0)
LYMPHS ABS: 0.8 10*3/uL (ref 0.7–4.0)
Lymphocytes Relative: 6 % — ABNORMAL LOW (ref 12–46)
MCH: 25.9 pg — ABNORMAL LOW (ref 26.0–34.0)
MCHC: 32.2 g/dL (ref 30.0–36.0)
MCV: 80.6 fL (ref 78.0–100.0)
Monocytes Absolute: 0.5 10*3/uL (ref 0.1–1.0)
Monocytes Relative: 4 % (ref 3–12)
NEUTROS ABS: 12 10*3/uL — AB (ref 1.7–7.7)
NEUTROS PCT: 90 % — AB (ref 43–77)
Platelets: 330 10*3/uL (ref 150–400)
RBC: 3.97 MIL/uL (ref 3.87–5.11)
RDW: 15.4 % (ref 11.5–15.5)
WBC: 13.4 10*3/uL — ABNORMAL HIGH (ref 4.0–10.5)

## 2014-01-20 LAB — COMPREHENSIVE METABOLIC PANEL
ALK PHOS: 93 U/L (ref 39–117)
ALT: 18 U/L (ref 0–35)
AST: 20 U/L (ref 0–37)
Albumin: 2.9 g/dL — ABNORMAL LOW (ref 3.5–5.2)
Anion gap: 7 (ref 5–15)
BILIRUBIN TOTAL: 0.7 mg/dL (ref 0.3–1.2)
BUN: 10 mg/dL (ref 6–23)
CHLORIDE: 98 meq/L (ref 96–112)
CO2: 33 mmol/L — ABNORMAL HIGH (ref 19–32)
CREATININE: 0.88 mg/dL (ref 0.50–1.10)
Calcium: 8.4 mg/dL (ref 8.4–10.5)
GFR calc non Af Amer: 87 mL/min — ABNORMAL LOW (ref >90)
GLUCOSE: 206 mg/dL — AB (ref 70–99)
POTASSIUM: 3.5 mmol/L (ref 3.5–5.1)
Sodium: 138 mmol/L (ref 135–145)
Total Protein: 7.2 g/dL (ref 6.0–8.3)

## 2014-01-20 LAB — GLUCOSE, CAPILLARY
GLUCOSE-CAPILLARY: 196 mg/dL — AB (ref 70–99)
Glucose-Capillary: 216 mg/dL — ABNORMAL HIGH (ref 70–99)
Glucose-Capillary: 189 mg/dL — ABNORMAL HIGH (ref 70–99)

## 2014-01-20 LAB — HEMOGLOBIN A1C
HEMOGLOBIN A1C: 9.1 % — AB (ref <5.7)
MEAN PLASMA GLUCOSE: 214 mg/dL — AB (ref <117)

## 2014-01-20 LAB — GLUCOSE, RANDOM: GLUCOSE: 235 mg/dL — AB (ref 70–99)

## 2014-01-20 MED ORDER — CLINDAMYCIN PHOSPHATE 900 MG/50ML IV SOLN: 900.0000 mg | Freq: Three times a day (TID) | INTRAVENOUS | Status: DC

## 2014-01-20 MED ORDER — DOXYCYCLINE HYCLATE 100 MG PO TABS
100.0000 mg | ORAL_TABLET | Freq: Two times a day (BID) | ORAL | Status: DC
  Administered 2014-01-20: 100 mg via ORAL
  Filled 2014-01-20 (×4): qty 1

## 2014-01-20 MED ORDER — SODIUM CHLORIDE 0.9 % IV SOLN
3.0000 g | Freq: Four times a day (QID) | INTRAVENOUS | Status: DC
  Administered 2014-01-20 – 2014-01-22 (×8): 3 g via INTRAVENOUS
  Filled 2014-01-20 (×10): qty 3

## 2014-01-20 MED ORDER — GENTAMICIN SULFATE 40 MG/ML IJ SOLN: 2.0000 mg/kg | Freq: Three times a day (TID) | INTRAVENOUS | Status: DC

## 2014-01-20 MED ORDER — INSULIN ASPART 100 UNIT/ML ~~LOC~~ SOLN
0.0000 [IU] | Freq: Three times a day (TID) | SUBCUTANEOUS | Status: DC
Start: 2014-01-20 — End: 2014-01-21
  Administered 2014-01-20: 7 [IU] via SUBCUTANEOUS
  Administered 2014-01-21: 4 [IU] via SUBCUTANEOUS

## 2014-01-20 MED ORDER — GENTAMICIN SULFATE 40 MG/ML IJ SOLN
Freq: Three times a day (TID) | INTRAVENOUS | Status: DC
  Administered 2014-01-20: 10:00:00 via INTRAVENOUS
  Filled 2014-01-20 (×3): qty 3.75

## 2014-01-20 MED ORDER — SODIUM CHLORIDE 0.9 % IV SOLN
1.0000 g | Freq: Four times a day (QID) | INTRAVENOUS | Status: DC
  Administered 2014-01-20 (×2): 1 g via INTRAVENOUS
  Filled 2014-01-20 (×4): qty 1000

## 2014-01-20 MED ORDER — LACTATED RINGERS IV SOLN
INTRAVENOUS | Status: DC
  Administered 2014-01-20 – 2014-01-22 (×6): via INTRAVENOUS

## 2014-01-20 MED ORDER — INSULIN ASPART 100 UNIT/ML ~~LOC~~ SOLN: 0.0000 [IU] | Freq: Every day | SUBCUTANEOUS | Status: DC

## 2014-01-20 NOTE — Consult Note (Addendum)
[facility]  Total days of antibiotics 4        Day 1 ampicillin        Day 1 gent        Day 1 clindamycin       Reason for Consult: fever and abdominal pain    Referring Physician: stringer  Active Problems:   Endometritis    HPI: Nicole Cherry is a 33 y.o. female  With DM and heavy menses who underwent endometrium ablation on January 01, 2012 withhysteroscopy confirmed that there was no perforation. Her post-procedure complication has mostly been abdominal discomfort leading to numerous medical visits. She had transvaginal ultrasound on 12/17 and no masses or abscesses were appreciated. The patient received an empiric course of ciprofloxacin, and doxycycline. She was admitted on 12/21 due to increasing abdominal pain localizing now to RUQ nad RLQ pain and leukocytosis of 13K with tachycardia. Her SIRS presentation led her to be admitted to [facility] for IV antibiotics. Infectious work up showed ua some pyuria, ua showed 80,000 strep bovis, gc/chlamydia is negative. abd CT on admit did now show any abn to explain abdominal pain or leukocytosis. She was initiated on levofloxacin plus doxycycline for 3 days with no improvement in wbc. She had temp of 103 last night, which prompted drawing blood cx and change in antibiotics to ampicillin, gent and clindamycin.    During these 3 wks, she denies any sexual intercourse, has had heaving vaginal bleeding of using 4-5 pads per day which finished yesterday. She denies dysuria or diarrhea. No other illness. 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   Past Medical History  Diagnosis Date  . Diabetes     Allergies: No Known Allergies  MEDICATIONS: . ampicillin (OMNIPEN) IV  1 g Intravenous 4 times per day  . docusate sodium  200 mg Oral BID  . gentamicin (GARAMYCIN) with clindamycin (CLEOCIN) IV   Intravenous Q8H  . Influenza vac split quadrivalent PF  0.5 mL Intramuscular  Tomorrow-1000  . insulin aspart  0-20 Units Subcutaneous TID WC  . insulin aspart  0-5 Units Subcutaneous QHS  . metFORMIN  500 mg Oral BID WC  . naproxen sodium  550 mg Oral BID WC  . polyethylene glycol  17 g Oral Daily    History  Substance Use Topics  . Smoking status: Never Smoker   . Smokeless tobacco: Not on file  . Alcohol Use: No    History reviewed. No pertinent family history.  Review of Systems  Constitutional: positive for fever, chills, diaphoresis z 1 day. Otherwise negative activity change, appetite change, fatigue and unexpected weight change.  HENT: Negative for congestion, sore throat, rhinorrhea, sneezing, trouble swallowing and sinus pressure.  Eyes: Negative for photophobia and visual disturbance.  Respiratory: Negative for cough, chest tightness, shortness of breath, wheezing and stridor.  Cardiovascular: Negative for chest pain, palpitations and leg swelling.  Gastrointestinal: positive for abd pain. Negative for nausea, vomiting,diarrhea, constipation, blood in stool, abdominal distention and anal bleeding.  Genitourinary: Negative for dysuria, hematuria, flank pain and difficulty urinating.  Musculoskeletal: Negative for myalgias, back pain, joint swelling, arthralgias and gait problem.  Skin: Negative for color change, pallor, rash and wound.  Neurological: Negative for dizziness, tremors, weakness and light-headedness.  Hematological: Negative for adenopathy. Does not bruise/bleed easily.  Psychiatric/Behavioral: Negative for behavioral problems, confusion, sleep disturbance, dysphoric mood, decreased concentration and agitation.      OBJECTIVE:  Temp:  [97.7 F (36.5 C)-103.1 F (39.5 C)] 100.3 F (37.9 C) (12/24 1400) Pulse Rate:  [75-119] 105 (12/24 1400) Resp:  [18-20] 20 (12/24 1400) BP: (88-132)/(49-98) 102/54 mmHg (12/24 1400) SpO2:  [93 %-100 %] 96 % (12/24 1400) Weight:  [263 lb (119.296 kg)] 263 lb (119.296 kg) (12/24 0558) Physical  Exam  Constitutional:  oriented to person, place, and time. appears well-developed and well-nourished. No distress.  HENT:  Mouth/Throat: Oropharynx is clear and moist. No oropharyngeal exudate.  Cardiovascular: Normal rate, regular rhythm and normal heart sounds. Exam reveals no gallop and no friction rub.  No murmur heard.  Pulmonary/Chest: Effort normal and breath sounds normal. No respiratory distress.  has no wheezes.  Abdominal: Soft. TTP in RUQ and RLQ. +guarding with percussion. Decrease BS Lymphadenopathy: no cervical adenopathy.  Neurological: alert and oriented to person, place, and time.  Skin: Skin is warm and dry. No rash noted. No erythema.  Psychiatric: a normal mood and affect.  behavior is normal.   LABS: Results for orders placed or performed during the hospital encounter of 01/17/14 (from the past 48 hour(s))  Glucose, capillary     Status: Abnormal   Collection Time: 01/18/14  5:01 PM  Result Value Ref Range   Glucose-Capillary 162 (H) 70 - 99 mg/dL   Comment 1 Notify RN    Comment 2 Documented in Chart   Glucose, capillary     Status: Abnormal   Collection Time: 01/18/14  9:18 PM  Result Value Ref Range   Glucose-Capillary 188 (H) 70 - 99 mg/dL   Comment 1 Documented in Chart   Glucose, random     Status: Abnormal   Collection Time: 01/19/14  5:29 AM  Result Value Ref Range   Glucose, Bld 178 (H) 70 - 99 mg/dL  CBC with Differential     Status: Abnormal   Collection Time: 01/19/14  5:29 AM  Result Value Ref Range   WBC 12.5 (H) 4.0 - 10.5 K/uL   RBC 3.93 3.87 - 5.11 MIL/uL   Hemoglobin 10.1 (L) 12.0 - 15.0 g/dL   HCT 16.132.1 (L) 09.636.0 - 04.546.0 %   MCV 81.7 78.0 - 100.0 fL   MCH 25.7 (L) 26.0 - 34.0 pg   MCHC 31.5 30.0 - 36.0 g/dL   RDW 40.915.1 81.111.5 - 91.415.5 %   Platelets 301 150 - 400 K/uL   Neutrophils Relative % 82 (H) 43 - 77 %   Neutro Abs 10.2 (H) 1.7 - 7.7 K/uL   Lymphocytes Relative 12 12 - 46 %   Lymphs Abs 1.5 0.7 - 4.0 K/uL   Monocytes Relative 6 3 -  12 %   Monocytes Absolute 0.8 0.1 - 1.0 K/uL   Eosinophils Relative 0 0 - 5 %   Eosinophils Absolute 0.1 0.0 - 0.7 K/uL   Basophils Relative 0 0 - 1 %   Basophils Absolute 0.0 0.0 - 0.1 K/uL  Glucose, capillary     Status: Abnormal   Collection Time: 01/19/14  5:31 AM  Result Value Ref Range   Glucose-Capillary 155 (H) 70 - 99 mg/dL   Comment 1 Documented in Chart   Glucose, random     Status: Abnormal   Collection Time: 01/20/14  5:25 AM  Result Value Ref Range   Glucose, Bld 235 (H) 70 - 99 mg/dL  CBC with Differential     Status: Abnormal   Collection Time: 01/20/14  5:25 AM  Result Value Ref Range   WBC 13.4 (  H) 4.0 - 10.5 K/uL   RBC 3.97 3.87 - 5.11 MIL/uL   Hemoglobin 10.3 (L) 12.0 - 15.0 g/dL   HCT 16.1 (L) 09.6 - 04.5 %   MCV 80.6 78.0 - 100.0 fL   MCH 25.9 (L) 26.0 - 34.0 pg   MCHC 32.2 30.0 - 36.0 g/dL   RDW 40.9 81.1 - 91.4 %   Platelets 330 150 - 400 K/uL   Neutrophils Relative % 90 (H) 43 - 77 %   Neutro Abs 12.0 (H) 1.7 - 7.7 K/uL   Lymphocytes Relative 6 (L) 12 - 46 %   Lymphs Abs 0.8 0.7 - 4.0 K/uL   Monocytes Relative 4 3 - 12 %   Monocytes Absolute 0.5 0.1 - 1.0 K/uL   Eosinophils Relative 0 0 - 5 %   Eosinophils Absolute 0.0 0.0 - 0.7 K/uL   Basophils Relative 0 0 - 1 %   Basophils Absolute 0.0 0.0 - 0.1 K/uL  Glucose, capillary     Status: Abnormal   Collection Time: 01/20/14  5:28 AM  Result Value Ref Range   Glucose-Capillary 196 (H) 70 - 99 mg/dL  Glucose, capillary     Status: Abnormal   Collection Time: 01/20/14  2:05 PM  Result Value Ref Range   Glucose-Capillary 216 (H) 70 - 99 mg/dL    MICRO: 78/29 blood cx pending 12/17 urine cx 80,000 strep bovis IMAGING: FINDINGS: Lung bases are clear.  Liver is enlarged, measuring 21.5 cm in length. No focal liver lesions are identified. The gallbladder wall does not appear appreciably thickened. There is no biliary duct dilatation.  Spleen, pancreas, and adrenals appear normal. Kidneys  bilaterally show no mass or hydronephrosis on either side. There is no renal or ureteral calculus on either side.  In the pelvis, the urinary bladder is midline. Urinary bladder is partially decompressed. The urinary bladder wall thickness is felt to be within normal limits given the partial decompression.  The endometrium measures 21 mm in thickness, thickened. No air is appreciable the appendix region appears normal. There is no periappendiceal region inflammatory change. There is a small ventral hernia containing only fat.  There is no bowel obstruction. There is no free air or portal venous air. There is no appreciable adenopathy. A few small retroperitoneal lymph nodes do not meet size criteria for pathologic significance. There is no abscess or ascites in the abdomen or pelvis. There is no demonstrable abdominal aortic aneurysm. There are no blastic or lytic bone lesions.  IMPRESSION: The endometrium appears rather prominent by CT, but there is no well-defined mass or air in the endometrium. Pelvic ultrasound advised for more precise assessment of the endometrium, particularly given the clinical history.  There is no mass in the pelvis. There is no bowel obstruction. There is no periappendiceal region inflammation. No abscess.  There is a small ventral hernia containing only fat.  Liver enlarged without focal lesion.  Assessment/Plan:  34yo F with intermittent abd pain, now localizing to RUQ x 2-3 wk since endometrium ablation, now with leukocytosis and fever, concern for intra-abdominal infection, possibly secondary bacteremia. Found to have strep bovis on urine cx but only 80,000 colonies  - will change abtx: amp/sub plus doxycycline - will check hiv testing - will repeat cmp to see if any transiminitis or obstructive pattern on hepatic panel,  - will get RUQ u/s since guarding on exam - if she has ongoing fevers, would repeat blood cx x 2 - most recent blood cx  drawn this morning, too early to tell if she has evidence of bacteremia   Dr. Daiva EvesVan Dam to provide further recs

## 2014-01-20 NOTE — Progress Notes (Signed)
Pt awake with c/o LLQ pain 7/10; pt refused PO dilaudid at this time. PO tylenol given for pain and mylicon for gas. Pt states she just doesn't feel good. Pt vomits mod amount of undigested food onto floor. Pt has been encouraged to ambulated in hall and warm PO fluids. Pt has not ambulated out of room this shift. Pt educated on side effects and how to minimize the side effects of dilaudid prior to administration.

## 2014-01-20 NOTE — Progress Notes (Addendum)
Nicole Cherry is a 33 y.o. year old female.  Subjective:  The patient reports that she still has pelvic pain. She had a small bowel movement this morning. She says that she feels better at this point but is aware when her temperature goes up.  Objective:  BP 98/60 mmHg  Pulse 119  Temp(Src) 101.8 F (38.8 C) (Oral)  Resp 20  Ht 5\' 1"  (1.549 m)  Wt 263 lb (119.296 kg)  BMI 49.72 kg/m2  SpO2 95%  LMP 12/31/2013   Maximum temperature equals 103.1  Results for orders placed or performed during the hospital encounter of 01/17/14 (from the past 24 hour(s))  Glucose, random     Status: Abnormal   Collection Time: 01/20/14  5:25 AM  Result Value Ref Range   Glucose, Bld 235 (H) 70 - 99 mg/dL  CBC with Differential     Status: Abnormal   Collection Time: 01/20/14  5:25 AM  Result Value Ref Range   WBC 13.4 (H) 4.0 - 10.5 K/uL   RBC 3.97 3.87 - 5.11 MIL/uL   Hemoglobin 10.3 (L) 12.0 - 15.0 g/dL   HCT 11.932.0 (L) 14.736.0 - 82.946.0 %   MCV 80.6 78.0 - 100.0 fL   MCH 25.9 (L) 26.0 - 34.0 pg   MCHC 32.2 30.0 - 36.0 g/dL   RDW 56.215.4 13.011.5 - 86.515.5 %   Platelets 330 150 - 400 K/uL   Neutrophils Relative % 90 (H) 43 - 77 %   Neutro Abs 12.0 (H) 1.7 - 7.7 K/uL   Lymphocytes Relative 6 (L) 12 - 46 %   Lymphs Abs 0.8 0.7 - 4.0 K/uL   Monocytes Relative 4 3 - 12 %   Monocytes Absolute 0.5 0.1 - 1.0 K/uL   Eosinophils Relative 0 0 - 5 %   Eosinophils Absolute 0.0 0.0 - 0.7 K/uL   Basophils Relative 0 0 - 1 %   Basophils Absolute 0.0 0.0 - 0.1 K/uL     CBC    Component Value Date/Time   WBC 13.4* 01/20/2014 0525   RBC 3.97 01/20/2014 0525   HGB 10.3* 01/20/2014 0525   HCT 32.0* 01/20/2014 0525   PLT 330 01/20/2014 0525   MCV 80.6 01/20/2014 0525   MCH 25.9* 01/20/2014 0525   MCHC 32.2 01/20/2014 0525   RDW 15.4 01/20/2014 0525   LYMPHSABS 0.8 01/20/2014 0525   MONOABS 0.5 01/20/2014 0525   EOSABS 0.0 01/20/2014 0525   BASOSABS 0.0 01/20/2014 0525     Chest: Clear Heart:  Regular rate and rhythm Abdomen: Soft, nontender, no rebound Extremities: Nontender, no masses  Assessment:  Fever  Presumed endometritis  Obesity  Diabetes  Elevation in white blood cell count  Status post NovaSure ablation of the endometrium of 12/31/2013  Plan:  Blood cultures were sent today.  Levaquin and metronidazole was discontinued by Dr. Estanislado Pandyivard. The patient has been started on ampicillin, gentamicin, and clindamycin.  I have consulted infectious disease to help with the management of this patient.  I have recommended an enema.  Nicole Cherry M.D.  01/20/2014  12:06 PM

## 2014-01-20 NOTE — Progress Notes (Signed)
Pt requesting multiple blankets and states she is cold; pt requested heat to be turned up. Pt is lying on k pad used for abdominal pain. Pt mother requested temp at this time, oral temp 100.7.

## 2014-01-20 NOTE — Significant Event (Signed)
Rapid Response Event Note  Overview: Time Called: 1023 Arrival Time: 1024 Event Type: Hypotension  Initial Focused Assessment: Called to assess pt with hypotension, tachycardia and increased temp. (103.1 oral).  Pt found to be alert and oriented, sitting on the side of the bed, with her only complaint of feeling "yucky and sweaty". Reviewed chart with RN.  Will continue to monitor closely for now and discuss with MD on rounds.  Interventions:   Event Summary:   at       at    Outcome: Stayed in room and stabalized (RN will continue to monitor VS's and discuss with MD on rounds)  Event End Time: 1030  Tia AlertGrady, Ginna Schuur A

## 2014-01-20 NOTE — Progress Notes (Signed)
Nicole Cherry is a 33 y.o. year old female.  Subjective:  The patient feels better then this morning. She has had 3 bowel movements today without the assistance of an enema.  Objective:  BP 105/71 mmHg  Pulse 107  Temp(Src) 99.7 F (37.6 C) (Oral)  Resp 28  Ht 5\' 1"  (1.549 m)  Wt 263 lb (119.296 kg)  BMI 49.72 kg/m2  SpO2 97%  LMP 12/31/2013   CBC    Component Value Date/Time   WBC 13.4* 01/20/2014 0525   RBC 3.97 01/20/2014 0525   HGB 10.3* 01/20/2014 0525   HCT 32.0* 01/20/2014 0525   PLT 330 01/20/2014 0525   MCV 80.6 01/20/2014 0525   MCH 25.9* 01/20/2014 0525   MCHC 32.2 01/20/2014 0525   RDW 15.4 01/20/2014 0525   LYMPHSABS 0.8 01/20/2014 0525   MONOABS 0.5 01/20/2014 0525   EOSABS 0.0 01/20/2014 0525   BASOSABS 0.0 01/20/2014 0525   Results for orders placed or performed during the hospital encounter of 01/17/14 (from the past 24 hour(s))  Glucose, random     Status: Abnormal   Collection Time: 01/20/14  5:25 AM  Result Value Ref Range   Glucose, Bld 235 (H) 70 - 99 mg/dL  CBC with Differential     Status: Abnormal   Collection Time: 01/20/14  5:25 AM  Result Value Ref Range   WBC 13.4 (H) 4.0 - 10.5 K/uL   RBC 3.97 3.87 - 5.11 MIL/uL   Hemoglobin 10.3 (L) 12.0 - 15.0 g/dL   HCT 28.432.0 (L) 13.236.0 - 44.046.0 %   MCV 80.6 78.0 - 100.0 fL   MCH 25.9 (L) 26.0 - 34.0 pg   MCHC 32.2 30.0 - 36.0 g/dL   RDW 10.215.4 72.511.5 - 36.615.5 %   Platelets 330 150 - 400 K/uL   Neutrophils Relative % 90 (H) 43 - 77 %   Neutro Abs 12.0 (H) 1.7 - 7.7 K/uL   Lymphocytes Relative 6 (L) 12 - 46 %   Lymphs Abs 0.8 0.7 - 4.0 K/uL   Monocytes Relative 4 3 - 12 %   Monocytes Absolute 0.5 0.1 - 1.0 K/uL   Eosinophils Relative 0 0 - 5 %   Eosinophils Absolute 0.0 0.0 - 0.7 K/uL   Basophils Relative 0 0 - 1 %   Basophils Absolute 0.0 0.0 - 0.1 K/uL  Glucose, capillary     Status: Abnormal   Collection Time: 01/20/14  5:28 AM  Result Value Ref Range   Glucose-Capillary 196 (H) 70 - 99  mg/dL  Hemoglobin [id number]     Status: Abnormal   Collection Time: 01/20/14  8:20 AM  Result Value Ref Range   Hgb A1c MFr Bld 9.1 (H) <5.7 %   Mean Plasma Glucose 214 (H) <117 mg/dL  Glucose, capillary     Status: Abnormal   Collection Time: 01/20/14  2:05 PM  Result Value Ref Range   Glucose-Capillary 216 (H) 70 - 99 mg/dL  Comprehensive metabolic panel     Status: Abnormal   Collection Time: 01/20/14  4:15 PM  Result Value Ref Range   Sodium 138 135 - 145 mmol/L   Potassium 3.5 3.5 - 5.1 mmol/L   Chloride 98 96 - 112 mEq/L   CO2 33 (H) 19 - 32 mmol/L   Glucose, Bld 206 (H) 70 - 99 mg/dL   BUN 10 6 - 23 mg/dL   Creatinine, Ser 3.470.88 0.50 - 1.10 mg/dL   Calcium 8.4 8.4 - 42.510.5  mg/dL   Total Protein 7.2 6.0 - 8.3 g/dL   Albumin 2.9 (L) 3.5 - 5.2 g/dL   AST 20 0 - 37 U/L   ALT 18 0 - 35 U/L   Alkaline Phosphatase 93 39 - 117 U/L   Total Bilirubin 0.7 0.3 - 1.2 mg/dL   GFR calc non Af Amer 87 (L) >90 mL/min   GFR calc Af Amer >90 >90 mL/min   Anion gap 7 5 - 15   Abdomen: Soft  Assessment:  Fever of uncertain etiology  Questionable endometritis that is slow to respond to previous antibiotics  Diabetes  Abdominal pain  Plan:  I appreciate the Infectious Disease consultation. The recommendations are as follows:  Assessment/Plan: 33yo F with intermittent abd pain, now localizing to RUQ x 2-3 wk since endometrium ablation, now with leukocytosis and fever, concern for intra-abdominal infection, possibly secondary bacteremia. Found to have strep bovis on urine cx but only 80,000 colonies  - will change abtx: amp/sub plus doxycycline - will check hiv testing - will repeat cmp to see if any transiminitis or obstructive pattern on hepatic panel,  - will get RUQ u/s since guarding on exam - if she has ongoing fevers, would repeat blood cx x 2 - most recent blood cx drawn this morning, too early to tell if she has evidence of bacteremia   It will be difficult to control the  patient's blood sugars in the face of infection. I will request an endocrinology consultation to help manage her diabetes.  Leonard SchwartzArthur Vernon Nicole Cherry M.D.  01/20/14  7:55 PM

## 2014-01-21 ENCOUNTER — Encounter (HOSPITAL_COMMUNITY): Payer: Self-pay | Admitting: Surgery

## 2014-01-21 ENCOUNTER — Inpatient Hospital Stay (HOSPITAL_COMMUNITY): Payer: BC Managed Care – PPO

## 2014-01-21 ENCOUNTER — Ambulatory Visit (HOSPITAL_COMMUNITY)
Admission: EM | Admit: 2014-01-21 | Discharge: 2014-01-21 | Disposition: A | Discharge status: Home / Self Care | Payer: BC Managed Care – PPO | Source: Home / Self Care | Attending: Surgery | Admitting: Surgery

## 2014-01-21 DIAGNOSIS — R109 Unspecified abdominal pain: Secondary | ICD-10-CM

## 2014-01-21 LAB — CBC WITH DIFFERENTIAL/PLATELET
BASOS ABS: 0 10*3/uL (ref 0.0–0.1)
BASOS PCT: 0 % (ref 0–1)
EOS PCT: 1 % (ref 0–5)
Eosinophils Absolute: 0.1 10*3/uL (ref 0.0–0.7)
HCT: 27.8 % — ABNORMAL LOW (ref 36.0–46.0)
Hemoglobin: 8.9 g/dL — ABNORMAL LOW (ref 12.0–15.0)
Lymphocytes Relative: 12 % (ref 12–46)
Lymphs Abs: 1 10*3/uL (ref 0.7–4.0)
MCH: 25.8 pg — AB (ref 26.0–34.0)
MCHC: 32 g/dL (ref 30.0–36.0)
MCV: 80.6 fL (ref 78.0–100.0)
Monocytes Absolute: 0.8 10*3/uL (ref 0.1–1.0)
Monocytes Relative: 9 % (ref 3–12)
NEUTROS PCT: 78 % — AB (ref 43–77)
Neutro Abs: 7 10*3/uL (ref 1.7–7.7)
Platelets: 250 10*3/uL (ref 150–400)
RBC: 3.45 MIL/uL — AB (ref 3.87–5.11)
RDW: 15.7 % — AB (ref 11.5–15.5)
WBC: 9 10*3/uL (ref 4.0–10.5)

## 2014-01-21 LAB — GLUCOSE, CAPILLARY
GLUCOSE-CAPILLARY: 154 mg/dL — AB (ref 70–99)
Glucose-Capillary: 116 mg/dL — ABNORMAL HIGH (ref 70–99)
Glucose-Capillary: 92 mg/dL (ref 70–99)
Glucose-Capillary: 117 mg/dL — ABNORMAL HIGH (ref 70–99)

## 2014-01-21 LAB — HIV ANTIBODY (ROUTINE TESTING W REFLEX): HIV 1&2 Ab, 4th Generation: NONREACTIVE

## 2014-01-21 MED ORDER — ALUM & MAG HYDROXIDE-SIMETH 200-200-20 MG/5ML PO SUSP: 30.0000 mL | Freq: Four times a day (QID) | ORAL | Status: DC | PRN

## 2014-01-21 MED ORDER — INSULIN ASPART 100 UNIT/ML ~~LOC~~ SOLN: 0.0000 [IU] | Freq: Three times a day (TID) | SUBCUTANEOUS | Status: DC

## 2014-01-21 MED ORDER — BISACODYL 10 MG RE SUPP: 10.0000 mg | Freq: Two times a day (BID) | RECTAL | Status: DC | PRN

## 2014-01-21 MED ORDER — MAGIC MOUTHWASH
15.0000 mL | Freq: Four times a day (QID) | ORAL | Status: DC | PRN
  Administered 2014-01-22 – 2014-01-23 (×2): 15 mL via ORAL
  Filled 2014-01-21 (×3): qty 15

## 2014-01-21 MED ORDER — LACTATED RINGERS IV BOLUS (SEPSIS): 1000.0000 mL | Freq: Three times a day (TID) | INTRAVENOUS | Status: DC | PRN

## 2014-01-21 MED ORDER — LIP MEDEX EX OINT
1.0000 | TOPICAL_OINTMENT | Freq: Two times a day (BID) | CUTANEOUS | Status: DC
Start: 2014-01-21 — End: 2014-01-26
  Administered 2014-01-21 – 2014-01-25 (×9): 1 via TOPICAL
  Filled 2014-01-21: qty 7

## 2014-01-21 MED ORDER — INSULIN DETEMIR 100 UNIT/ML ~~LOC~~ SOLN
5.0000 [IU] | Freq: Every morning | SUBCUTANEOUS | Status: DC
  Filled 2014-01-21 (×2): qty 0.05

## 2014-01-21 MED ORDER — DOXYCYCLINE HYCLATE 100 MG IV SOLR
100.0000 mg | Freq: Two times a day (BID) | INTRAVENOUS | Status: DC
  Administered 2014-01-21 – 2014-01-22 (×3): 100 mg via INTRAVENOUS
  Filled 2014-01-21 (×4): qty 100

## 2014-01-21 MED ORDER — ACETAMINOPHEN 650 MG RE SUPP: 650.0000 mg | Freq: Four times a day (QID) | RECTAL | Status: DC | PRN

## 2014-01-21 MED ORDER — TECHNETIUM TC 99M MEBROFENIN IV KIT
5.3000 | PACK | Freq: Once | INTRAVENOUS | Status: AC | PRN
  Administered 2014-01-21: 5 via INTRAVENOUS

## 2014-01-21 MED ORDER — PHENOL 1.4 % MT LIQD
2.0000 | OROMUCOSAL | Status: DC | PRN
  Administered 2014-01-22: 2 via OROMUCOSAL
  Filled 2014-01-21: qty 177

## 2014-01-21 MED ORDER — LIVING WELL WITH DIABETES BOOK
Freq: Once | Status: AC
  Administered 2014-01-21: 15:00:00
  Filled 2014-01-21: qty 1

## 2014-01-21 MED ORDER — INSULIN DETEMIR 100 UNIT/ML ~~LOC~~ SOLN: 5.0000 [IU] | Freq: Every day | SUBCUTANEOUS | Status: DC

## 2014-01-21 MED ORDER — HYDROMORPHONE HCL 1 MG/ML IJ SOLN
1.0000 mg | INTRAMUSCULAR | Status: DC | PRN
  Administered 2014-01-21: 1 mg via INTRAVENOUS
  Filled 2014-01-21: qty 1

## 2014-01-21 NOTE — Plan of Care (Signed)
Problem: Phase II Progression Outcomes Goal: Progress activity as tolerated unless otherwise ordered Outcome: Completed/Met Date Met:  01/21/14 Has been up in the room a lot and has tolerated walking in the hall. Goal: Discharge plan established Outcome: Progressing Afebrile Pain controlled Symptoms of infection resolving Understands home and F/U care Goal: Other Phase II Outcomes/Goals Outcome: Completed/Met Date Met:  01/21/14 Had a good BM per patient.

## 2014-01-21 NOTE — Progress Notes (Addendum)
Hospital day 5  Subjective: Patient reports nausea but no vomiting.  She reports continued abdominal pain but less than before she rates it at 6 out of 10 intensity.  Pain is mostly in right upper quadrant and radiates to upper chest. Has been nothing by mouth overnight. Has had normal bowel movements overnight. Denies fevers or chills or unusual vaginal discharge. Denies lower abdominal pain.  Objective: I have reviewed patient's vital signs. Filed Vitals:   01/21/14 0126 01/21/14 0523 01/21/14 0543 01/21/14 1000  BP: 99/56 124/57  117/68  Pulse: 96 89  84  Temp: 98.6 F (37 C) 98.3 F (36.8 C)  98.3 F (36.8 C)  TempSrc: Oral Oral  Oral  Resp: 22 22  20   Height:      Weight:   265 lb 3 oz (120.288 kg)   SpO2: 95% 98%  95%   Tm: 103.1 12/24 @10  am.  Last fever 100.9 12/24@ noon.    General: alert, cooperative and no distress Resp: clear to auscultation bilaterally Cardio: regular rate and rhythm and systolic murmur: holosystolic 2/6, blowing at 2nd left intercostal space GI: Soft, tender to palpation in right mid and upper abdomen, no rebound no guarding. Abdomen nontender on left side and lower abdomen area. Extremities: extremities normal, atraumatic, no cyanosis or edema   CBC Latest Ref Rng 01/21/2014 01/20/2014 01/19/2014  WBC 4.0 - 10.5 K/uL 9.0 13.4(H) 12.5(H)  Hemoglobin 12.0 - 15.0 g/dL 2.4(M8.9(L) 10.3(L) 10.1(L)  Hematocrit 36.0 - 46.0 % 27.8(L) 32.0(L) 32.1(L)  Platelets 150 - 400 K/uL 250 330 301   FSGs: 196/216/189   CMP     Component Value Date/Time   NA 138 01/20/2014 1615   K 3.5 01/20/2014 1615   CL 98 01/20/2014 1615   CO2 33* 01/20/2014 1615   GLUCOSE 206* 01/20/2014 1615   BUN 10 01/20/2014 1615   CREATININE 0.88 01/20/2014 1615   CALCIUM 8.4 01/20/2014 1615   PROT 7.2 01/20/2014 1615   ALBUMIN 2.9* 01/20/2014 1615   AST 20 01/20/2014 1615   ALT 18 01/20/2014 1615   ALKPHOS 93 01/20/2014 1615   BILITOT 0.7 01/20/2014 1615   GFRNONAA 87*  01/20/2014 1615   GFRAA >90 01/20/2014 1615   NONREACTIVE (12/24 1615) - HIV  Blood Culture    Component Value Date/Time   SDES BLOOD RIGHT ARM 01/20/2014 0820   SPECREQUEST BOTTLES DRAWN AEROBIC ONLY 5CC EACH BOTTLE 01/20/2014 0820   CULT  01/20/2014 0820           BLOOD CULTURE RECEIVED NO GROWTH TO DATE CULTURE WILL BE HELD FOR 5 DAYS BEFORE ISSUING A FINAL NEGATIVE REPORT [facility]    REPTSTATUS PENDING 01/20/2014 0820      12/25:  Abdominal Ultrasound:  No gallstones are noted within gallbladder. There is positive sonographic Murphy's sign with borderline thickening of gallbladder wall up to 3 mm. Clinical correlation is necessary to exclude early cholecystitis. Normal CBD.  Assessment/Plan: 33 year old female hospital day #5 admitted for abdominal pain thought to be secondary to endometritis,  with a history of endometrial ablation via NovaSure, with continued abdominal pain especially in the right upper quadrant and also fevers  -Continue with antibiotics uterus seen and doxycycline as per infectious disease  -Internal medicine consultation requested for better diabetes control  -Will discuss ultrasound results with general surgery  -Continue with monitoring,  daily CBC  -Pain medication PRN  UNIT FLOOR TIME 80 MINUTES.   Tyreik Delahoussaye WAKURU 01/21/2014, 11:01 AM

## 2014-01-21 NOTE — Progress Notes (Signed)
Inpatient Diabetes Program Recommendations  AACE/ADA: New Consensus Statement on Inpatient Glycemic Control (2013)  Target Ranges:  Prepandial:   less than 140 mg/dL      Peak postprandial:   less than 180 mg/dL (1-2 hours)      Critically ill patients:  140 - 180 mg/dL   Reason for Visit: Diabetes Consult  Diabetes history: DM2 Outpatient Diabetes medications: metformin 500 bid Current orders for Inpatient glycemic control: Levemir 5 units QAM, Novolog sensitive tidwc  Recommendations: Increase Levemir to 15 units QAM. (120 kg x .15) Change Novolog to moderate Q4H while NPO, then tidwc and HS Needs f/u with PCP for diabetes management - HgbA1C is 9.1% indicating poor control. If pt is to be discharged on insulin, will need insulin starter kit ordered and RN to begin teaching insulin administration. Will order Living Well With Diabetes book and encourage pt to view diabetes videos on pt ed channel. Will order OP Diabetes Education consult for uncontrolled DM. [facility] will call pt after discharge to set up appt.  Will continue to follow. Thank you. Lorenda Peck, RD, LDN, CDE Inpatient Diabetes Coordinator [phone]

## 2014-01-21 NOTE — Consult Note (Addendum)
[facility]  [address] [place], [place] [address] Phone: [phone] FAX: [place]  12-Sep-1980 [id number]  CARE TEAM:  PCP: No PCP Per Patient  Outpatient Care Team: Patient Care Team: No Pcp Per Patient as PCP - General (General Practice)  Inpatient Treatment Team: Treatment Team: Attending Provider: Ena Dawley, MD; Chaplain: Benard Halsted, Chaplain; Registered Nurse: Bonney Aid, RN; Registered Nurse: Burnice Logan, RN; Registered Nurse: Elissa Lovett, RN; Registered Nurse: Donalda Ewings, RN; Rounding Team: Jackelyn Knife, MD; Consulting Physician: Nolon Nations, MD  This patient is a 33 y.o.female who presents today for surgical evaluation at the request of Ema Karren Burly, MD.   Reason for evaluation: Abnormal ultrasound ? cholecystitis  Morbidly obese female with endometriosis.  Underwent NovaSure endometrial ablation 3 weeks ago.  Concern for possible endometritis.  Also urinary tract infection.  Placed on antibiotics.  Had lower back pain.  Has been to the emergency room a few times.  Admitted.  Pain primarily in lower abdomen.  Did have ultrasound and CT scan showing some mild inflammation but no evidence of perforation or other abnormalities.  Did have elevated white count and increased heart rate.  Again some pyuria noted.  Had recurrent fever.  Infectious disease consultation requested.  Workup included ultrasound which showed gallbladder wall 3 mm question of borderline thickening.  Concern for sonographic Murphy sign.  Therefore surgical consultation requested today  Abdominal pain seems to have become more focal on the right upper side.  She describes as a constant pain that has lasted for several days.  She did have an episode of nausea and vomiting last week.  She denies any history of heartburn or reflux.  She normally can tolerate eating anything without any symptoms.  No  postprandial pain, nausea, bloating, vomiting.  Can walk 20 minutes without difficulty.  No history of cardiac issues.  No productive cough.  Denies much alcohol use.  No history of hepatitis or transaminitis.  No history of jaundice.  No history of pancreatitis that she can recall.  No history of Crohn's or ulcerative colitis.  No inflammatory bowel disease.  No irritable bowel syndrome.  No diet sensitivities to spicy fatty greasy or dairy foods.  According to the floor nurses, she has been tolerating her solid diet for the past few days.  Uncle is in the room with her.  Past Medical History  Diagnosis Date  . Diabetes     Past Surgical History  Procedure Laterality Date  . Tubal ligation    . Cesarean section      History   Social History  . Marital Status: Married    Spouse Name: N/A    Number of Children: N/A  . Years of Education: N/A   Occupational History  . Not on file.   Social History Main Topics  . Smoking status: Never Smoker   . Smokeless tobacco: Not on file  . Alcohol Use: No  . Drug Use: No  . Sexual Activity: Yes    Birth Control/ Protection: Surgical   Other Topics Concern  . Not on file   Social History Narrative    History reviewed. No pertinent family history.  Current Facility-Administered Medications  Medication Dose Route Frequency Provider Last Rate Last Dose  . acetaminophen (TYLENOL) tablet 650 mg  650 mg Oral Q4H PRN Ena Dawley, MD   650 mg at 01/20/14 0908  .  Ampicillin-Sulbactam (UNASYN) 3 g in sodium chloride 0.9 % 100 mL IVPB  3 g Intravenous Q6H Carlyle Basques, MD   3 g at 01/21/14 0940  . docusate sodium (COLACE) capsule 200 mg  200 mg Oral BID Delsa Bern, MD   200 mg at 01/20/14 2157  . doxycycline (VIBRA-TABS) tablet 100 mg  100 mg Oral Q12H Carlyle Basques, MD   100 mg at 01/20/14 1728  . guaiFENesin (ROBITUSSIN) 100 MG/5ML solution 300 mg  15 mL Oral Q4H PRN Ena Dawley, MD      . HYDROmorphone (DILAUDID) tablet 2 mg  2  mg Oral Q3H PRN Delsa Bern, MD   2 mg at 01/21/14 1149  . Influenza vac split quadrivalent PF (FLUARIX) injection 0.5 mL  0.5 mL Intramuscular Tomorrow-1000 Ena Dawley, MD      . insulin aspart (novoLOG) injection 0-5 Units  0-5 Units Subcutaneous QHS Delsa Bern, MD   0 Units at 01/20/14 2158  . insulin aspart (novoLOG) injection 0-9 Units  0-9 Units Subcutaneous TID WC Reyne Dumas, MD      . insulin detemir (LEVEMIR) injection 5 Units  5 Units Subcutaneous QHS Reyne Dumas, MD      . lactated ringers infusion   Intravenous Continuous Ena Dawley, MD 125 mL/hr at 01/21/14 0340    . menthol-cetylpyridinium (CEPACOL) lozenge 3 mg  1 lozenge Oral Q2H PRN Ena Dawley, MD      . naproxen sodium (ANAPROX) tablet 550 mg  550 mg Oral BID WC Delsa Bern, MD   550 mg at 01/20/14 1729  . ondansetron (ZOFRAN) tablet 4 mg  4 mg Oral Q6H PRN Ena Dawley, MD   4 mg at 01/20/14 1731   Or  . ondansetron [facility]) injection 4 mg  4 mg Intravenous Q6H PRN Ena Dawley, MD   4 mg at 01/20/14 0701  . polyethylene glycol (MIRALAX / GLYCOLAX) packet 17 g  17 g Oral Daily Delsa Bern, MD   17 g at 01/19/14 1949  . simethicone (MYLICON) chewable tablet 80 mg  80 mg Oral QID PRN Ena Dawley, MD   80 mg at 01/20/14 0538     No Known Allergies  ROS: Constitutional:  No fevers, chills, sweats.  Weight stable Eyes:  No vision changes, No discharge HENT:  No sore throats, nasal drainage Lymph: No neck swelling, No bruising easily Pulmonary:  No cough, productive sputum CV: No orthopnea, PND  Patient walks 20 minutes without difficulty.  No exertional chest/neck/shoulder/arm pain. GI: No personal nor family history of GI/colon cancer, inflammatory bowel disease, irritable bowel syndrome, allergy such as Celiac Sprue, dietary/dairy problems, colitis, ulcers nor gastritis.  No recent sick contacts/gastroenteritis.  No travel outside the country.  No changes in diet. Renal: No UTIs, No  hematuria Genital:  No drainage, bleeding, masses Musculoskeletal: No severe joint pain.  Good ROM major joints Skin:  No sores or lesions.  No rashes Heme/Lymph:  No easy bleeding.  No swollen lymph nodes Neuro: No focal weakness/numbness.  No seizures Psych: No suicidal ideation.  No hallucinations  BP 117/68 mmHg  Pulse 84  Temp(Src) 98.3 F (36.8 C) (Oral)  Resp 20  Ht _0  (1.549 m)  Wt 265 lb 3 oz (120.288 kg)  BMI 50.13 kg/m2  SpO2 95%  LMP 12/31/2013  Physical Exam: General: Pt awake/alert/oriented x4 in no major acute distress Eyes: PERRL, normal EOM. Sclera nonicteric Neuro: CN II-XII intact w/o focal sensory/motor deficits. Lymph: No head/neck/groin lymphadenopathy Psych:  No delerium/psychosis/paranoia HENT:  Normocephalic, Mucus membranes moist.  No thrush Neck: Supple, No tracheal deviation Chest: No pain.  Good respiratory excursion. CV:  Pulses intact.  Regular rhythm Abdomen: Soft, Obese.  Nondistended.  Non-tender except mod TTP RUQ > epigastric > chest wall/flank without Murphy's sign.  Small umbilical hernia - reducible, sensitive.  No incarcerated hernias. Ext:  SCDs BLE.  No significant edema.  No cyanosis Skin: No petechiae / purpurea.  No major sores Musculoskeletal: No severe joint pain.  Good ROM major joints   Results:   Labs: Results for orders placed or performed during the hospital encounter of 01/17/14 (from the past 48 hour(s))  Glucose, random     Status: Abnormal   Collection Time: 01/20/14  5:25 AM  Result Value Ref Range   Glucose, Bld 235 (H) 70 - 99 mg/dL  CBC with Differential     Status: Abnormal   Collection Time: 01/20/14  5:25 AM  Result Value Ref Range   WBC 13.4 (H) 4.0 - 10.5 K/uL   RBC 3.97 3.87 - 5.11 MIL/uL   Hemoglobin 10.3 (L) 12.0 - 15.0 g/dL   HCT 32.0 (L) 36.0 - 46.0 %   MCV 80.6 78.0 - 100.0 fL   MCH 25.9 (L) 26.0 - 34.0 pg   MCHC 32.2 30.0 - 36.0 g/dL   RDW 15.4 11.5 - 15.5 %   Platelets 330 150 - 400 K/uL    Neutrophils Relative % 90 (H) 43 - 77 %   Neutro Abs 12.0 (H) 1.7 - 7.7 K/uL   Lymphocytes Relative 6 (L) 12 - 46 %   Lymphs Abs 0.8 0.7 - 4.0 K/uL   Monocytes Relative 4 3 - 12 %   Monocytes Absolute 0.5 0.1 - 1.0 K/uL   Eosinophils Relative 0 0 - 5 %   Eosinophils Absolute 0.0 0.0 - 0.7 K/uL   Basophils Relative 0 0 - 1 %   Basophils Absolute 0.0 0.0 - 0.1 K/uL  Glucose, capillary     Status: Abnormal   Collection Time: 01/20/14  5:28 AM  Result Value Ref Range   Glucose-Capillary 196 (H) 70 - 99 mg/dL  Culture, blood (routine x 2)     Status: None (Preliminary result)   Collection Time: 01/20/14  8:20 AM  Result Value Ref Range   Specimen Description BLOOD RIGHT ARM    Special Requests BOTTLES DRAWN AEROBIC ONLY 5CC EACH BOTTLE    Culture             BLOOD CULTURE RECEIVED NO GROWTH TO DATE CULTURE WILL BE HELD FOR 5 DAYS BEFORE ISSUING A FINAL NEGATIVE REPORT Performed at [facility]    Report Status PENDING   Hemoglobin A1c     Status: Abnormal   Collection Time: 01/20/14  8:20 AM  Result Value Ref Range   Hgb A1c MFr Bld 9.1 (H) <5.7 %    Comment: (NOTE)                                                                       According to the ADA Clinical Practice Recommendations for 2011, when HbA1c is used as a screening test:  >=6.5%   Diagnostic of Diabetes Mellitus           (  if abnormal result is confirmed) 5.7-6.4%   Increased risk of developing Diabetes Mellitus References:Diagnosis and Classification of Diabetes Mellitus,Diabetes ZOXW,9604,54(UJWJX 1):S62-S69 and Standards of Medical Care in         Diabetes - 2011,Diabetes BJYN,8295,62 (Suppl 1):S11-S61.    Mean Plasma Glucose 214 (H) <117 mg/dL    Comment: Performed at [facility]  Glucose, capillary     Status: Abnormal   Collection Time: 01/20/14  2:05 PM  Result Value Ref Range   Glucose-Capillary 216 (H) 70 - 99 mg/dL  Comprehensive metabolic panel     Status: Abnormal   Collection  Time: 01/20/14  4:15 PM  Result Value Ref Range   Sodium 138 135 - 145 mmol/L    Comment: Please note change in reference range.   Potassium 3.5 3.5 - 5.1 mmol/L    Comment: Please note change in reference range.   Chloride 98 96 - 112 mEq/L   CO2 33 (H) 19 - 32 mmol/L   Glucose, Bld 206 (H) 70 - 99 mg/dL   BUN 10 6 - 23 mg/dL   Creatinine, Ser 0.88 0.50 - 1.10 mg/dL   Calcium 8.4 8.4 - 10.5 mg/dL   Total Protein 7.2 6.0 - 8.3 g/dL   Albumin 2.9 (L) 3.5 - 5.2 g/dL   AST 20 0 - 37 U/L   ALT 18 0 - 35 U/L   Alkaline Phosphatase 93 39 - 117 U/L   Total Bilirubin 0.7 0.3 - 1.2 mg/dL   GFR calc non Af Amer 87 (L) >90 mL/min   GFR calc Af Amer >90 >90 mL/min    Comment: (NOTE) The eGFR has been calculated using the CKD EPI equation. This calculation has not been validated in all clinical situations. eGFR's persistently <90 mL/min signify possible Chronic Kidney Disease.    Anion gap 7 5 - 15  HIV antibody     Status: None   Collection Time: 01/20/14  4:15 PM  Result Value Ref Range   HIV 1&2 Ab, 4th Generation NONREACTIVE NONREACTIVE    Comment: (NOTE) A NONREACTIVE HIV Ag/Ab result does not exclude HIV infection since the time frame for seroconversion is variable. If acute HIV infection is suspected, a HIV-1 RNA Qualitative TMA test is recommended. HIV-1/2 Antibody Diff         Not indicated. HIV-1 RNA, Qual TMA           Not indicated. PLEASE NOTE: This information has been disclosed to you from records whose confidentiality may be protected by state law. If your state requires such protection, then the state law prohibits you from making any further disclosure of the information without the specific written consent of the person to whom it pertains, or as otherwise permitted by law. A general authorization for the release of medical or other information is NOT sufficient for this purpose. The performance of this assay has not been clinically validated in patients less than 51  years old. Performed at [facility]   Glucose, capillary     Status: Abnormal   Collection Time: 01/20/14  9:55 PM  Result Value Ref Range   Glucose-Capillary 189 (H) 70 - 99 mg/dL  CBC with Differential     Status: Abnormal   Collection Time: 01/21/14  7:06 AM  Result Value Ref Range   WBC 9.0 4.0 - 10.5 K/uL   RBC 3.45 (L) 3.87 - 5.11 MIL/uL   Hemoglobin 8.9 (L) 12.0 - 15.0 g/dL   HCT 27.8 (L) 36.0 -  46.0 %   MCV 80.6 78.0 - 100.0 fL   MCH 25.8 (L) 26.0 - 34.0 pg   MCHC 32.0 30.0 - 36.0 g/dL   RDW 15.7 (H) 11.5 - 15.5 %   Platelets 250 150 - 400 K/uL    Comment: REPEATED TO VERIFY DELTA CHECK NOTED SPECIMEN CHECKED FOR CLOTS    Neutrophils Relative % 78 (H) 43 - 77 %   Neutro Abs 7.0 1.7 - 7.7 K/uL   Lymphocytes Relative 12 12 - 46 %   Lymphs Abs 1.0 0.7 - 4.0 K/uL   Monocytes Relative 9 3 - 12 %   Monocytes Absolute 0.8 0.1 - 1.0 K/uL   Eosinophils Relative 1 0 - 5 %   Eosinophils Absolute 0.1 0.0 - 0.7 K/uL   Basophils Relative 0 0 - 1 %   Basophils Absolute 0.0 0.0 - 0.1 K/uL  Glucose, capillary     Status: Abnormal   Collection Time: 01/21/14  9:27 AM  Result Value Ref Range   Glucose-Capillary 154 (H) 70 - 99 mg/dL    Imaging / Studies: US Transvaginal Non-ob  02-09-14   CLINICAL DATA:  Low back pain, bleeding, status post endometrial ablation on 12/31/2013  EXAM: TRANSABDOMINAL AND TRANSVAGINAL ULTRASOUND OF PELVIS  TECHNIQUE: Both transabdominal and transvaginal ultrasound examinations of the pelvis were performed. Transabdominal technique was performed for global imaging of the pelvis including uterus, ovaries, adnexal regions, and pelvic cul-de-sac. It was necessary to proceed with endovaginal exam following the transabdominal exam to visualize the endometrium.  COMPARISON:  None  FINDINGS: Uterus  Measurements: 11.2 x 5.0 x 5.8 cm. No fibroids or other mass visualized.  Endometrium  Endometrium is difficult clip discretely measure/poorly visualized. No  discrete fluid collection/hemorrhage. Scattered hyperechoic foci likely reflect gas.  Right ovary  Measurements: 4.1 x 2.3 x 2.4 cm. Multiple small cysts/follicles, measuring up to 1.9 cm.  Left ovary  Measurements: 2.2 x 1.5 x 1.3 cm. Normal appearance/no adnexal mass.  Other findings  No free fluid.  IMPRESSION: Gas within the endometrial cavity, likely postprocedural, although infection/endometritis is not entirely excluded. Correlate for fever/leukocytosis.  No associated discrete fluid collection/hemorrhage.  No free fluid/hemoperitoneum.   Electronically Signed   By: Julian Hy M.D.   On: 2014-02-09 14:30   US Pelvis Complete  2014/02/09   CLINICAL DATA:  Low back pain, bleeding, status post endometrial ablation on 12/31/2013  EXAM: TRANSABDOMINAL AND TRANSVAGINAL ULTRASOUND OF PELVIS  TECHNIQUE: Both transabdominal and transvaginal ultrasound examinations of the pelvis were performed. Transabdominal technique was performed for global imaging of the pelvis including uterus, ovaries, adnexal regions, and pelvic cul-de-sac. It was necessary to proceed with endovaginal exam following the transabdominal exam to visualize the endometrium.  COMPARISON:  None  FINDINGS: Uterus  Measurements: 11.2 x 5.0 x 5.8 cm. No fibroids or other mass visualized.  Endometrium  Endometrium is difficult clip discretely measure/poorly visualized. No discrete fluid collection/hemorrhage. Scattered hyperechoic foci likely reflect gas.  Right ovary  Measurements: 4.1 x 2.3 x 2.4 cm. Multiple small cysts/follicles, measuring up to 1.9 cm.  Left ovary  Measurements: 2.2 x 1.5 x 1.3 cm. Normal appearance/no adnexal mass.  Other findings  No free fluid.  IMPRESSION: Gas within the endometrial cavity, likely postprocedural, although infection/endometritis is not entirely excluded. Correlate for fever/leukocytosis.  No associated discrete fluid collection/hemorrhage.  No free fluid/hemoperitoneum.   Electronically Signed   By:  Julian Hy M.D.   On: February 09, 2014 14:30   Ct Abdomen Pelvis W  Contrast  01/17/2014   CLINICAL DATA:  Abdominal pain with nausea and vomiting. Endometrial ablation procedures 3 weeks prior  EXAM: CT ABDOMEN AND PELVIS WITH CONTRAST  TECHNIQUE: Multidetector CT imaging of the abdomen and pelvis was performed using the standard protocol following bolus administration of intravenous contrast. Oral contrast was also administered.  CONTRAST:  76m OMNIPAQUE IOHEXOL 300 MG/ML SOLN, 1050mOMNIPAQUE IOHEXOL 300 MG/ML SOLN  COMPARISON:  Pelvic ultrasound January 13, 2014  FINDINGS: Lung bases are clear.  Liver is enlarged, measuring 21.5 cm in length. No focal liver lesions are identified. The gallbladder wall does not appear appreciably thickened. There is no biliary duct dilatation.  Spleen, pancreas, and adrenals appear normal. Kidneys bilaterally show no mass or hydronephrosis on either side. There is no renal or ureteral calculus on either side.  In the pelvis, the urinary bladder is midline. Urinary bladder is partially decompressed. The urinary bladder wall thickness is felt to be within normal limits given the partial decompression.  The endometrium measures 21 mm in thickness, thickened. No air is appreciable the appendix region appears normal. There is no periappendiceal region inflammatory change. There is a small ventral hernia containing only fat.  There is no bowel obstruction. There is no free air or portal venous air. There is no appreciable adenopathy. A few small retroperitoneal lymph nodes do not meet size criteria for pathologic significance. There is no abscess or ascites in the abdomen or pelvis. There is no demonstrable abdominal aortic aneurysm. There are no blastic or lytic bone lesions.  IMPRESSION: The endometrium appears rather prominent by CT, but there is no well-defined mass or air in the endometrium. Pelvic ultrasound advised for more precise assessment of the endometrium,  particularly given the clinical history.  There is no mass in the pelvis. There is no bowel obstruction. There is no periappendiceal region inflammation. No abscess.  There is a small ventral hernia containing only fat.  Liver enlarged without focal lesion.   Electronically Signed   By: WiLowella Grip.D.   On: 01/17/2014 17:59   UsKoreabdomen Limited  01/21/2014   CLINICAL DATA:  Right upper quadrant pain for 2 weeks  EXAM: USKoreaBDOMEN LIMITED - RIGHT UPPER QUADRANT  COMPARISON:  CT scan 01/17/2014.  FINDINGS: Gallbladder:  No gallstones are noted within gallbladder. Borderline thickening gallbladder wall up to 3 mm. There is positive sonographic Murphy's sign. Clinical correlation is necessary to exclude early cholecystitis.  Common bile duct:  Diameter: 5 mm in diameter within normal limits.  Liver:  No focal lesion identified. Within normal limits in parenchymal echogenicity.  IMPRESSION: 1. No gallstones are noted within gallbladder. There is positive sonographic Murphy's sign with borderline thickening of gallbladder wall up to 3 mm. Clinical correlation is necessary to exclude early cholecystitis. Normal CBD.   Electronically Signed   By: LiLahoma Crocker.D.   On: 01/21/2014 09:04    Medications / Allergies: per chart  Antibiotics: Anti-infectives    Start     Dose/Rate Route Frequency Ordered Stop   01/20/14 1600  doxycycline (VIBRA-TABS) tablet 100 mg     100 mg Oral Every 12 hours 01/20/14 1501     01/20/14 1530  Ampicillin-Sulbactam (UNASYN) 3 g in sodium chloride 0.9 % 100 mL IVPB     3 g100 mL/hr over 60 Minutes Intravenous Every 6 hours 01/20/14 1501     01/20/14 0900  gentamicin (GARAMYCIN) 150 mg, clindamycin (CLEOCIN) 900 mg in dextrose 5 % 100 mL IVPB  Status:  Discontinued     219.5 mL/hr over 30 Minutes Intravenous Every 8 hours 01/20/14 0756 01/20/14 1501   01/20/14 0800  ampicillin (OMNIPEN) 1 g in sodium chloride 0.9 % 50 mL IVPB  Status:  Discontinued     1 g150 mL/hr over 20  Minutes Intravenous 4 times per day 01/20/14 0746 01/20/14 1501   01/20/14 0800  clindamycin (CLEOCIN) IVPB 900 mg  Status:  Discontinued     900 mg100 mL/hr over 30 Minutes Intravenous 3 times per day 01/20/14 0746 01/20/14 0756   01/20/14 0800  gentamicin (GARAMYCIN) 240 mg in dextrose 5 % 50 mL IVPB  Status:  Discontinued     2 mg/kg  119.3 kg112 mL/hr over 30 Minutes Intravenous Every 8 hours 01/20/14 0746 01/20/14 0757   01/17/14 2200  doxycycline (VIBRA-TABS) tablet 100 mg  Status:  Discontinued     100 mg Oral Every 12 hours 01/17/14 2149 01/18/14 1357   01/17/14 2200  levofloxacin (LEVAQUIN) IVPB 500 mg  Status:  Discontinued     500 mg100 mL/hr over 60 Minutes Intravenous Every 24 hours 01/17/14 2149 01/20/14 0746   01/17/14 1845  levofloxacin (LEVAQUIN) IVPB 500 mg  Status:  Discontinued     500 mg100 mL/hr over 60 Minutes Intravenous  Once 01/17/14 1842 01/17/14 2149      Assessment  Nicole Cherry  33 y.o. female       Problem List:  Active Problems:   Morbid obesity   Diabetes mellitus type 2, uncontrolled   Endometritis   Abdominal pain - right side   Constant R abd pain and patient status post endometrial ablation with abdominal pains.  Possible gallbladder etiology but atypical to have acalculus cholecystitis in young woman.  Plan:  Does not seem classic story for biliary colic or cholecystitis.  Could be diaphragmatic irritation status post endometrial ablation.  Can be steatohepatitis/hepatitis given her large liver.  Could be musculoskeletal pull/strain.  However given her pain in that location reasonable to more aggressively evaluate.  Could consider hepatic panel and other medical workup if scan negative.  Nothing by mouth for now.  If HIDA scan study delayed, can do clears until nothing by mouth per nuclear medicine.  Discussed with nurse.  HIDA scan to r/o biliary obstruction.  If negative then would check for biliary dyskinesia with GB ejection fraction.   Challenge to do on a holiday.  Discussed with Dr. Anselm Pancoast with radiology.  Might have to wait until tomorrow.  Would not wait until next week.  If studies WNL, argues against GB etiology - consider hepatitis workup for gastroenterology evaluation.  Treat for any musculoskeletal strain.  If positive, consider chole:  The anatomy & physiology of hepatobiliary & pancreatic function was discussed.  The pathophysiology of gallbladder dysfunction was discussed.  Natural history risks without surgery was discussed.   I feel the risks of no intervention will lead to serious problems that outweigh the operative risks; therefore, I recommended cholecystectomy to remove the pathology.  I explained laparoscopic techniques with possible need for an open approach.  Probable cholangiogram to evaluate the bilary tract was explained as well.    Risks such as bleeding, infection, abscess, leak, injury to other organs, need for further treatment, stroke, heart attack, death, and other risks were discussed.  I noted a good likelihood this will help address the problem.  Possibility that this will not correct all abdominal symptoms was explained.  Goals of post-operative recovery were discussed as well.  We will work to minimize complications.  An educational handout further explaining the pathology and treatment options was given as well.  Questions were answered.  The patient & uncle expresses understanding & wish to proceed with surgery if studies suspicious    -VTE prophylaxis- SCDs, etc   -mobilize as tolerated to help recovery    Adin [place], M.D., F.A.C.S. Gastrointestinal and Minimally Invasive Surgery [facility] [address], [place], [place] [address] [phone] Main / Paging   01/21/2014  Note: Portions of this report may have been transcribed using voice recognition software. Every effort was made to ensure accuracy; however, inadvertent computerized  transcription errors may be present.   Any transcriptional errors that result from this process are unintentional.

## 2014-01-21 NOTE — H&P (Addendum)
[facility] History and Physical  Nicole Cherry WUJ:[id number] DOB: 25-Jul-1980 DOA: 01/17/2014  Referring physician: PCP: No PCP Per Patient   Chief Complaint: Consult requested by Dr Sallye OberKulwa for Management of hyperglycemia   HPI:  Nicole Cherry is a 33 y.o. female With DM and heavy menses who underwent endometrium ablation on January 01, 2012 withhysteroscopy confirmed that there was no perforation. Her post-procedure complication has mostly been abdominal discomfort leading to numerous medical visits. She had transvaginal ultrasound on 12/17 and no masses or abscesses were appreciated. The patient received an empiric course of ciprofloxacin, and doxycycline. She was admitted on 12/21 due to increasing abdominal pain localizing now to RUQ nad RLQ pain and leukocytosis of 13K with tachycardia. Her SIRS presentation led [facility]. Infectious work up showed ua some pyuria, ua showed 80,000 strep bovis, gc/chlamydia is negative. abd CT on admit did not show any abnormality to explain abdominal pain or leukocytosis. She was initiated on levofloxacin plus doxycycline for 3 days with no improvement in wbc. Infectious disease was consulted and recommended change of antibiotic to Unasyn plus doxycycline, HIV testing, examination of the right upper quadrant. Patient is currently nothing by mouth for her ultrasound. HIV antibody nonreactive  CBG has ranged between 154 and 216. Kidney function is okay. Hemoglobin A1c 9.1     Review of Systems: negative for the following  Constitutional: Denies fever, chills, diaphoresis, appetite change and fatigue.  HEENT: Denies photophobia, eye pain, redness, hearing loss, ear pain, congestion, sore throat, rhinorrhea, sneezing, mouth sores, trouble swallowing, neck pain, neck stiffness and tinnitus.  Respiratory: Denies SOB, DOE, cough, chest tightness, and wheezing.  Cardiovascular: Denies chest pain, palpitations and  leg swelling.  Gastrointestinal: Denies nausea, vomiting, abdominal pain, diarrhea, constipation, blood in stool and abdominal distention.  Genitourinary: Denies dysuria, urgency, frequency, hematuria, flank pain and difficulty urinating.  Musculoskeletal: Denies myalgias, back pain, joint swelling, arthralgias and gait problem.  Skin: Denies pallor, rash and wound.  Neurological: Denies dizziness, seizures, syncope, weakness, light-headedness, numbness and headaches.  Hematological: Denies adenopathy. Easy bruising, personal or family bleeding history  Psychiatric/Behavioral: Denies suicidal ideation, mood changes, confusion, nervousness, sleep disturbance and agitation       Past Medical History  Diagnosis Date  . Diabetes      Past Surgical History  Procedure Laterality Date  . Tubal ligation    . Cesarean section        Social History:  reports that she has never smoked. She does not have any smokeless tobacco history on file. She reports that she does not drink alcohol or use illicit drugs.    No Known Allergies  History reviewed. No pertinent family history.   Prior to Admission medications   Medication Sig Start Date End Date Taking? Authorizing Provider  doxycycline (VIBRA-TABS) 100 MG tablet Take 100 mg by mouth 2 (two) times daily. For 10 days   Yes Historical Provider, MD  ibuprofen (ADVIL,MOTRIN) 600 MG tablet Take 600 mg by mouth every 6 (six) hours as needed for moderate pain.   Yes Historical Provider, MD  metFORMIN (GLUCOPHAGE) 500 MG tablet Take 1 tablet (500 mg total) by mouth 2 (two) times daily with a meal. 07/20/13  Yes Linna HoffJames D Kindl, MD  naproxen sodium (ANAPROX) 220 MG tablet Take 440 mg by mouth 2 (two) times daily with a meal.   Yes Historical Provider, MD  oxyCODONE-acetaminophen (ROXICET) 5-325 MG per tablet Take 1-2 tablets by mouth  every 4 (four) hours as needed. 01/13/14 01/13/15 Yes Nigel Bridgeman, CNM  ciprofloxacin (CIPRO) 250 MG tablet Take 250  mg by mouth 2 (two) times daily.    Historical Provider, MD  meclizine (ANTIVERT) 50 MG tablet Take 1 tablet (50 mg total) by mouth 3 (three) times daily as needed. For dizziness Patient not taking: Reported on 01/13/2014 07/20/13   Linna Hoff, MD     Physical Exam: Filed Vitals:   01/21/14 0126 01/21/14 0523 01/21/14 0543 01/21/14 1000  BP: 99/56 124/57  117/68  Pulse: 96 89  84  Temp: 98.6 F (37 C) 98.3 F (36.8 C)  98.3 F (36.8 C)  TempSrc: Oral Oral  Oral  Resp: 22 22  20   Height:      Weight:   120.288 kg (265 lb 3 oz)   SpO2: 95% 98%  95%     Constitutional: positive for fever, chills, diaphoresis z 1 day. Otherwise negative activity change, appetite change, fatigue and unexpected weight change.  HENT: Negative for congestion, sore throat, rhinorrhea, sneezing, trouble swallowing and sinus pressure.  Eyes: Negative for photophobia and visual disturbance.  Respiratory: Negative for cough, chest tightness, shortness of breath, wheezing and stridor.  Cardiovascular: Negative for chest pain, palpitations and leg swelling.  Gastrointestinal: positive for abd pain. Negative for nausea, vomiting,diarrhea, constipation, blood in stool, abdominal distention and anal bleeding.  Genitourinary: Negative for dysuria, hematuria, flank pain and difficulty urinating.  Musculoskeletal: Negative for myalgias, back pain, joint swelling, arthralgias and gait problem.  Skin: Negative for color change, pallor, rash and wound.  Neurological: Negative for dizziness, tremors, weakness and light-headedness.  Hematological: Negative for adenopathy. Does not bruise/bleed easily.  Psychiatric/Behavioral: Negative for behavioral problems, confusion, sleep disturbance, dysphoric mood, decreased concentration and agitation.      Labs on Admission:    Basic Metabolic Panel:  Recent Labs Lab 01/17/14 1344 01/18/14 0520 01/19/14 0529 01/20/14 0525 01/20/14 1615  NA 136*  --   --    --  138  K 3.9  --   --   --  3.5  CL 94*  --   --   --  98  CO2 28  --   --   --  33*  GLUCOSE 207* 214* 178* 235* 206*  BUN 13  --   --   --  10  CREATININE 0.71  --   --   --  0.88  CALCIUM 9.0  --   --   --  8.4   Liver Function Tests:  Recent Labs Lab 01/17/14 1344 01/20/14 1615  AST 14 20  ALT 14 18  ALKPHOS 117 93  BILITOT 0.5 0.7  PROT 8.3 7.2  ALBUMIN 3.8 2.9*    Recent Labs Lab 01/17/14 1344  LIPASE 26   No results for input(s): AMMONIA in the last 168 hours. CBC:  Recent Labs Lab 01/17/14 1344 01/18/14 0520 01/19/14 0529 01/20/14 0525 01/21/14 0706  WBC 15.5* 12.9* 12.5* 13.4* 9.0  NEUTROABS 13.1* 10.8* 10.2* 12.0* 7.0  HGB 11.1* 10.1* 10.1* 10.3* 8.9*  HCT 35.2* 31.7* 32.1* 32.0* 27.8*  MCV 82.1 80.7 81.7 80.6 80.6  PLT 338 298 301 330 250   Cardiac Enzymes: No results for input(s): CKTOTAL, CKMB, CKMBINDEX, TROPONINI in the last 168 hours.  BNP (last 3 results) No results for input(s): PROBNP in the last 8760 hours.    CBG:  Recent Labs Lab 01/19/14 0531 01/20/14 0528 01/20/14 1405 01/20/14 2155 01/21/14 0927  GLUCAP  155* 196* 216* 189* 154*    Radiological Exams on Admission: [place] Abdomen Limited  01/21/2014   CLINICAL DATA:  Right upper quadrant pain for 2 weeks  EXAM: US ABDOMEN LIMITED - RIGHT UPPER QUADRANT  COMPARISON:  CT scan 01/17/2014.  FINDINGS: Gallbladder:  No gallstones are noted within gallbladder. Borderline thickening gallbladder wall up to 3 mm. There is positive sonographic Murphy's sign. Clinical correlation is necessary to exclude early cholecystitis.  Common bile duct:  Diameter: 5 mm in diameter within normal limits.  Liver:  No focal lesion identified. Within normal limits in parenchymal echogenicity.  IMPRESSION: 1. No gallstones are noted within gallbladder. There is positive sonographic Murphy's sign with borderline thickening of gallbladder wall up to 3 mm. Clinical correlation is necessary to exclude early  cholecystitis. Normal CBD.   Electronically Signed   By: Natasha MeadLiviu  Pop M.D.   On: 01/21/2014 09:04    EKG: Independently reviewed.    Assessment/Plan Active Problems:   Endometritis  Diabetes mellitus On metformin at home Discontinue metformin Hemoglobin A1c 9.1 Start the patient on Levemir 5 units, sliding scale insulin TID AC If diet is resumed , will add NovoLog 3 units before each meal We will continue to follow along   Endometritis Per infectious disease    Code Status:   full Family Communication: bedside Disposition Plan: admit   Time spent: 70 mins   [facility] CenterBROL,Mattthew Ziomek [facility] Pager [phone]  If 7PM-7AM, please contact night-coverage [email or web address] Password [email or web address] 01/21/2014, 10:49 AM

## 2014-01-21 NOTE — Progress Notes (Signed)
Care link arranged for [facility] arrive by 2000.  Dr. Sallye OberKulwa and Dr. Michaell CowingGross notified.  Will continue to monitor.  Osvaldo AngstK. Melvia Matousek, RN-------------------------

## 2014-01-21 NOTE — Progress Notes (Signed)
Blood sugar at 2230 was 116

## 2014-01-22 ENCOUNTER — Inpatient Hospital Stay (HOSPITAL_COMMUNITY): Payer: BC Managed Care – PPO

## 2014-01-22 DIAGNOSIS — R109 Unspecified abdominal pain: Secondary | ICD-10-CM

## 2014-01-22 DIAGNOSIS — N719 Inflammatory disease of uterus, unspecified: Principal | ICD-10-CM

## 2014-01-22 DIAGNOSIS — R509 Fever, unspecified: Secondary | ICD-10-CM | POA: Insufficient documentation

## 2014-01-22 DIAGNOSIS — A491 Streptococcal infection, unspecified site: Secondary | ICD-10-CM | POA: Insufficient documentation

## 2014-01-22 DIAGNOSIS — E1165 Type 2 diabetes mellitus with hyperglycemia: Secondary | ICD-10-CM

## 2014-01-22 DIAGNOSIS — R1084 Generalized abdominal pain: Secondary | ICD-10-CM

## 2014-01-22 LAB — LIPASE, BLOOD: Lipase: 28 U/L (ref 11–59)

## 2014-01-22 LAB — CBC WITH DIFFERENTIAL/PLATELET
Basophils Absolute: 0 10*3/uL (ref 0.0–0.1)
Basophils Relative: 0 % (ref 0–1)
EOS PCT: 1 % (ref 0–5)
Eosinophils Absolute: 0.1 10*3/uL (ref 0.0–0.7)
HCT: 28.7 % — ABNORMAL LOW (ref 36.0–46.0)
HEMOGLOBIN: 9.3 g/dL — AB (ref 12.0–15.0)
LYMPHS ABS: 1.8 10*3/uL (ref 0.7–4.0)
LYMPHS PCT: 17 % (ref 12–46)
MCH: 25.9 pg — AB (ref 26.0–34.0)
MCHC: 32.4 g/dL (ref 30.0–36.0)
MCV: 79.9 fL (ref 78.0–100.0)
Monocytes Absolute: 0.8 10*3/uL (ref 0.1–1.0)
Monocytes Relative: 7 % (ref 3–12)
Neutro Abs: 8.2 10*3/uL — ABNORMAL HIGH (ref 1.7–7.7)
Neutrophils Relative %: 75 % (ref 43–77)
Platelets: 236 10*3/uL (ref 150–400)
RBC: 3.59 MIL/uL — AB (ref 3.87–5.11)
RDW: 15.7 % — ABNORMAL HIGH (ref 11.5–15.5)
WBC: 11 10*3/uL — ABNORMAL HIGH (ref 4.0–10.5)

## 2014-01-22 LAB — COMPREHENSIVE METABOLIC PANEL
ALK PHOS: 77 U/L (ref 39–117)
ALT: 22 U/L (ref 0–35)
AST: 34 U/L (ref 0–37)
Albumin: 2.4 g/dL — ABNORMAL LOW (ref 3.5–5.2)
Anion gap: 9 (ref 5–15)
BILIRUBIN TOTAL: 0.9 mg/dL (ref 0.3–1.2)
BUN: 11 mg/dL (ref 6–23)
CO2: 28 mmol/L (ref 19–32)
Calcium: 8.2 mg/dL — ABNORMAL LOW (ref 8.4–10.5)
Chloride: 100 mEq/L (ref 96–112)
Creatinine, Ser: 0.81 mg/dL (ref 0.50–1.10)
GFR calc Af Amer: >90 mL/min (ref >90)
GFR calc non Af Amer: >90 mL/min (ref >90)
Glucose, Bld: 232 mg/dL — ABNORMAL HIGH (ref 70–99)
Potassium: 4 mmol/L (ref 3.5–5.1)
SODIUM: 137 mmol/L (ref 135–145)
Total Protein: 6.3 g/dL (ref 6.0–8.3)

## 2014-01-22 MED ORDER — METHOCARBAMOL 1000 MG/10ML IJ SOLN
1000.0000 mg | Freq: Four times a day (QID) | INTRAMUSCULAR | Status: DC | PRN
  Filled 2014-01-22: qty 10

## 2014-01-22 MED ORDER — POLYETHYLENE GLYCOL 3350 17 G PO PACK
17.0000 g | PACK | Freq: Two times a day (BID) | ORAL | Status: DC
  Administered 2014-01-23 – 2014-01-24 (×3): 17 g via ORAL
  Filled 2014-01-22 (×4): qty 1

## 2014-01-22 MED ORDER — SODIUM CHLORIDE 0.9 % IV SOLN: INTRAVENOUS | Status: DC

## 2014-01-22 MED ORDER — INSULIN ASPART 100 UNIT/ML ~~LOC~~ SOLN
0.0000 [IU] | Freq: Three times a day (TID) | SUBCUTANEOUS | Status: DC
  Administered 2014-01-22: 4 [IU] via SUBCUTANEOUS
  Administered 2014-01-22: 7 [IU] via SUBCUTANEOUS
  Administered 2014-01-22: 4 [IU] via SUBCUTANEOUS
  Administered 2014-01-23 (×2): 3 [IU] via SUBCUTANEOUS
  Administered 2014-01-24: 4 [IU] via SUBCUTANEOUS
  Administered 2014-01-24: 3 [IU] via SUBCUTANEOUS
  Administered 2014-01-24: 4 [IU] via SUBCUTANEOUS

## 2014-01-22 MED ORDER — SODIUM CHLORIDE 0.9 % IJ SOLN
3.0000 mL | INTRAMUSCULAR | Status: DC | PRN
Start: 2014-01-22 — End: 2014-01-26

## 2014-01-22 MED ORDER — CEFTRIAXONE SODIUM IN DEXTROSE 40 MG/ML IV SOLN
2.0000 g | INTRAVENOUS | Status: DC
  Administered 2014-01-22 – 2014-01-24 (×3): 2 g via INTRAVENOUS
  Filled 2014-01-22 (×4): qty 50

## 2014-01-22 MED ORDER — METHOCARBAMOL 500 MG PO TABS
1000.0000 mg | ORAL_TABLET | Freq: Four times a day (QID) | ORAL | Status: DC | PRN
  Administered 2014-01-23 – 2014-01-24 (×3): 1000 mg via ORAL
  Filled 2014-01-22 (×4): qty 2

## 2014-01-22 MED ORDER — HYDROMORPHONE HCL 2 MG PO TABS
2.0000 mg | ORAL_TABLET | ORAL | Status: DC | PRN
  Administered 2014-01-22 – 2014-01-25 (×7): 2 mg via ORAL
  Filled 2014-01-22 (×7): qty 1

## 2014-01-22 MED ORDER — SODIUM CHLORIDE 0.9 % IJ SOLN
3.0000 mL | Freq: Two times a day (BID) | INTRAMUSCULAR | Status: DC
  Administered 2014-01-22 – 2014-01-25 (×5): 3 mL via INTRAVENOUS

## 2014-01-22 MED ORDER — INSULIN DETEMIR 100 UNIT/ML ~~LOC~~ SOLN
15.0000 [IU] | Freq: Every morning | SUBCUTANEOUS | Status: DC
  Administered 2014-01-22 – 2014-01-26 (×4): 15 [IU] via SUBCUTANEOUS
  Filled 2014-01-22 (×6): qty 0.15

## 2014-01-22 MED ORDER — SODIUM CHLORIDE 0.9 % IV SOLN
250.0000 mL | INTRAVENOUS | Status: DC | PRN
  Administered 2014-01-22: 250 mL via INTRAVENOUS

## 2014-01-22 MED ORDER — INSULIN ASPART 100 UNIT/ML ~~LOC~~ SOLN: 0.0000 [IU] | Freq: Every day | SUBCUTANEOUS | Status: DC

## 2014-01-22 MED ORDER — PANTOPRAZOLE SODIUM 40 MG PO TBEC
40.0000 mg | DELAYED_RELEASE_TABLET | Freq: Two times a day (BID) | ORAL | Status: DC
  Administered 2014-01-22 – 2014-01-25 (×6): 40 mg via ORAL
  Filled 2014-01-22 (×6): qty 1

## 2014-01-22 NOTE — Progress Notes (Signed)
[facility]      Day # 3 unasyn Day 3 of doxy but also 10 days of outpatient doxy   Subjective: No new complaints   Antibiotics:  Anti-infectives    Start     Dose/Rate Route Frequency Ordered Stop   01/22/14 1630  cefTRIAXone (ROCEPHIN) 2 g in dextrose 5 % 50 mL IVPB - Premix     2 g100 mL/hr over 30 Minutes Intravenous Every 24 hours 01/22/14 1607     01/21/14 1600  doxycycline (VIBRAMYCIN) 100 mg in dextrose 5 % 250 mL IVPB  Status:  Discontinued     100 mg125 mL/hr over 120 Minutes Intravenous Every 12 hours 01/21/14 1515 01/22/14 1607   01/20/14 1600  doxycycline (VIBRA-TABS) tablet 100 mg  Status:  Discontinued     100 mg Oral Every 12 hours 01/20/14 1501 01/21/14 1515   01/20/14 1530  Ampicillin-Sulbactam (UNASYN) 3 g in sodium chloride 0.9 % 100 mL IVPB  Status:  Discontinued     3 g100 mL/hr over 60 Minutes Intravenous Every 6 hours 01/20/14 1501 01/22/14 1607   01/20/14 0900  gentamicin (GARAMYCIN) 150 mg, clindamycin (CLEOCIN) 900 mg in dextrose 5 % 100 mL IVPB  Status:  Discontinued     219.5 mL/hr over 30 Minutes Intravenous Every 8 hours 01/20/14 0756 01/20/14 1501   01/20/14 0800  ampicillin (OMNIPEN) 1 g in sodium chloride 0.9 % 50 mL IVPB  Status:  Discontinued     1 g150 mL/hr over 20 Minutes Intravenous 4 times per day 01/20/14 0746 01/20/14 1501   01/20/14 0800  clindamycin (CLEOCIN) IVPB 900 mg  Status:  Discontinued     900 mg100 mL/hr over 30 Minutes Intravenous 3 times per day 01/20/14 0746 01/20/14 0756   01/20/14 0800  gentamicin (GARAMYCIN) 240 mg in dextrose 5 % 50 mL IVPB  Status:  Discontinued     2 mg/kg  119.3 kg112 mL/hr over 30 Minutes Intravenous Every 8 hours 01/20/14 0746 01/20/14 0757   01/17/14 2200  doxycycline (VIBRA-TABS) tablet 100 mg  Status:  Discontinued     100 mg Oral Every 12 hours 01/17/14 2149 01/18/14 1357   01/17/14 2200  levofloxacin (LEVAQUIN) IVPB 500 mg  Status:  Discontinued     500 mg100  mL/hr over 60 Minutes Intravenous Every 24 hours 01/17/14 2149 01/20/14 0746   01/17/14 1845  levofloxacin (LEVAQUIN) IVPB 500 mg  Status:  Discontinued     500 mg100 mL/hr over 60 Minutes Intravenous  Once 01/17/14 1842 01/17/14 2149      Medications: Scheduled Meds: . cefTRIAXone (ROCEPHIN)  IV  2 g Intravenous Q24H  . Influenza vac split quadrivalent PF  0.5 mL Intramuscular Tomorrow-1000  . insulin aspart  0-20 Units Subcutaneous TID WC  . insulin aspart  0-5 Units Subcutaneous QHS  . insulin detemir  15 Units Subcutaneous q morning - 10a  . lip balm  1 application Topical BID  . naproxen sodium  550 mg Oral BID WC  . pantoprazole  40 mg Oral BID AC  . polyethylene glycol  17 g Oral BID  . sodium chloride  3 mL Intravenous Q12H   Continuous Infusions:  PRN Meds:.sodium chloride, acetaminophen, acetaminophen, alum & mag hydroxide-simeth, bisacodyl, guaiFENesin, HYDROmorphone, HYDROmorphone, magic mouthwash, menthol-cetylpyridinium, methocarbamol (ROBAXIN)  IV, methocarbamol, ondansetron **OR** ondansetron (ZOFRAN) IV, phenol, simethicone, sodium chloride    Objective: Weight change: 5 lb 13 oz (2.637 kg)  Intake/Output Summary (Last 24 hours) at  01/22/14 1912 Last data filed at 01/22/14 1637  Gross per 24 hour  Intake   1920 ml  Output   2300 ml  Net   -380 ml   Blood pressure 112/69, pulse 96, temperature 98.7 F (37.1 C), temperature source Oral, resp. rate 18, height 5\' 1"  (1.549 m), weight 271 lb (122.925 kg), last menstrual period 12/31/2013, SpO2 98 %. Temp:  [98.7 F (37.1 C)-100.8 F (38.2 C)] 98.7 F (37.1 C) (12/26 1804) Pulse Rate:  [96-109] 96 (12/26 1804) Resp:  [18-20] 18 (12/26 1804) BP: (112-128)/(61-80) 112/69 mmHg (12/26 1804) SpO2:  [94 %-98 %] 98 % (12/26 1608) Weight:  [271 lb (122.925 kg)] 271 lb (122.925 kg) (12/26 0559)  Physical Exam: General: Alert and awake, oriented x3, not in any acute distress. HEENT: anicteric sclera, pupils reactive  to light and accommodation,  CVS regular rate, normal Chest:  no wheezing, rales or rhonchi Abdomen: soft nontender, nondistended, normal bowel sounds, Extremities: no  clubbing or edema noted bilaterally  Skin: no rashes  Neuro: nonfocal  CBC:  Recent Labs Lab 01/18/14 0520 01/19/14 0529 01/20/14 0525 01/21/14 0706 01/22/14 0700  HGB 10.1* 10.1* 10.3* 8.9* 9.3*  HCT 31.7* 32.1* 32.0* 27.8* 28.7*  PLT 298 301 330 250 236     BMET  Recent Labs  01/20/14 1615 01/22/14 0527  NA 138 137  K 3.5 4.0  CL 98 100  CO2 33* 28  GLUCOSE 206* 232*  BUN 10 11  CREATININE 0.88 0.81  CALCIUM 8.4 8.2*     Liver Panel   Recent Labs  01/20/14 1615 01/22/14 0527  PROT 7.2 6.3  ALBUMIN 2.9* 2.4*  AST 20 34  ALT 18 22  ALKPHOS 93 77  BILITOT 0.7 0.9       Sedimentation Rate No results for input(s): ESRSEDRATE in the last 72 hours. C-Reactive Protein No results for input(s): CRP in the last 72 hours.  Micro Results: Recent Results (from the past 720 hour(s))  GC/Chlamydia Probe Amp     Status: None   Collection Time: 01/13/14 10:50 AM  Result Value Ref Range Status   CT Probe RNA NEGATIVE NEGATIVE Final   GC Probe RNA NEGATIVE NEGATIVE Final    Comment: (NOTE)                                                                                       **Normal Reference Range: Negative**      Assay performed using the Gen-Probe APTIMA COMBO2 (R) Assay. Acceptable specimen types for this assay include APTIMA Swabs (Unisex, endocervical, urethral, or vaginal), first void urine, and ThinPrep liquid based cytology samples. Performed at [facility]   Urine culture     Status: None   Collection Time: 01/17/14  5:17 PM  Result Value Ref Range Status   Specimen Description URINE, CLEAN CATCH  Final   Special Requests NONE  Final   Culture  Setup Time   Final    01/18/2014 04:24 Performed at [facility]    Colony Count   Final    80,000  COLONIES/ML Performed at [facility]    Culture  Final    STREPTOCOCCUS GROUP D;high probability for S.bovis Performed at [facility]    Report Status 01/19/2014 FINAL  Final   Organism ID, Bacteria STREPTOCOCCUS GROUP D;high probability for S.bovis  Final      Susceptibility   Streptococcus group d;high probability for s.bovis - MIC (ETEST)*    PENICILLIN <=0.125 SENSITIVE Sensitive     * STREPTOCOCCUS GROUP D;high probability for S.bovis  Culture, blood (routine x 2)     Status: None (Preliminary result)   Collection Time: 01/20/14  8:20 AM  Result Value Ref Range Status   Specimen Description BLOOD RIGHT ARM  Final   Special Requests BOTTLES DRAWN AEROBIC ONLY 5CC EACH BOTTLE  Final   Culture   Final           BLOOD CULTURE RECEIVED NO GROWTH TO DATE CULTURE WILL BE HELD FOR 5 DAYS BEFORE ISSUING A FINAL NEGATIVE REPORT Performed at [facility]    Report Status PENDING  Incomplete    Studies/Results: Dg Chest 2 View  01/22/2014   CLINICAL DATA:  Shortness of breath when lying down  EXAM: CHEST  2 VIEW  COMPARISON:  None.  FINDINGS: Cardiomediastinal silhouette is unremarkable. Mild elevation of the right hemidiaphragm. Streaky right base medially atelectasis or infiltrate. No pulmonary edema. No pleural effusion.  IMPRESSION: No pulmonary edema. Streaky right base medially atelectasis or infiltrate.   Electronically Signed   By: Natasha MeadLiviu  Pop M.D.   On: 01/22/2014 16:36   Nm Hepatobiliary Including Gb  01/21/2014   CLINICAL DATA:  Right upper quadrant pain. Concern for acute cholecystitis.  EXAM: NUCLEAR MEDICINE HEPATOBILIARY IMAGING  TECHNIQUE: Sequential images of the abdomen were obtained out to 60 minutes following intravenous administration of radiopharmaceutical.  RADIOPHARMACEUTICALS:  5.3 Millicurie Tc-8279m Choletec  COMPARISON:  CT 01/17/2014  FINDINGS: There is prompt clearance of radiotracer from the blood pool and homogeneous uptake within the  liver. Counts are present within the bowel by 20 min. The gallbladder begins to fill at 45 min.  IMPRESSION: 1. Patent cystic duct and common bile duct. 2. No scintigraphic evidence of cholecystitis.   Electronically Signed   By: Genevive BiStewart  Edmunds M.D.   On: 01/21/2014 21:52   [place] Abdomen Limited  01/21/2014   CLINICAL DATA:  Right upper quadrant pain for 2 weeks  EXAM: US ABDOMEN LIMITED - RIGHT UPPER QUADRANT  COMPARISON:  CT scan 01/17/2014.  FINDINGS: Gallbladder:  No gallstones are noted within gallbladder. Borderline thickening gallbladder wall up to 3 mm. There is positive sonographic Murphy's sign. Clinical correlation is necessary to exclude early cholecystitis.  Common bile duct:  Diameter: 5 mm in diameter within normal limits.  Liver:  No focal lesion identified. Within normal limits in parenchymal echogenicity.  IMPRESSION: 1. No gallstones are noted within gallbladder. There is positive sonographic Murphy's sign with borderline thickening of gallbladder wall up to 3 mm. Clinical correlation is necessary to exclude early cholecystitis. Normal CBD.   Electronically Signed   By: Natasha MeadLiviu  Pop M.D.   On: 01/21/2014 09:04      Assessment/Plan:  Active Problems:   Morbid obesity   Diabetes mellitus type 2, uncontrolled   Endometritis   Abdominal pain - right side    Elain Carren RangJ Sandate is a 33 y.o. female with  Recent endometrial ablation forheavy menses who underwent endometrium ablation on January 01, 2012 withhysteroscopy confirmed that there was no perforation. Her post-procedure complication has mostly been abdominal discomfort leading to numerous medical visits. She  had transvaginal ultrasound on 12/17 and no masses or abscesses were appreciated. The patient received an empiric course of ciprofloxacin, and doxycycline. She was admitted on 12/21 due to increasing abdominal pain localizing now to RUQ nad RLQ pain and leukocytosis of 13K with tachycardia. Her SIRS presentation led her to be  admitted to [facility] for IV antibiotics. Infectious work up showed ua some pyuria, ua showed 80,000 strep bovis, gc/chlamydia is negative. abd CT on admit did now show any abn to explain abdominal pain or leukocytosis. She was initiated on levofloxacin plus doxycycline for 3 days with no improvement in wbc. She had temp to 103 last night, which prompted drawing blood cx and change in antibiotics to ampicillin, gent and clindamycin.She was seen by Dr Drue Second who simplified to unasyn and doxy. She has had thorough evaluation for biliary pathology but has had normal imaging including HIDA scan   #1 Fevers: I am wondering if she might have had occult S bovis bacteremia (that was not diagnosed due to no blood cultures having been done until AFTER antibiotics already started)   While bacteremia with this pathogen has high association with GI pathology and in partiular colonic pathology, colon cancer this patient has unremarkable CT imaging findings.   I did also find a case report of S bovis that occcurred in association with endometrial cancer and so I imagine it can also occur as complication of endometritis.   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 Certainly    LOS: 5 days   Acey Lav 01/22/2014, 7:12 PM

## 2014-01-22 NOTE — Progress Notes (Signed)
Fasting blood sugar 202 at 0630 this am

## 2014-01-22 NOTE — Progress Notes (Addendum)
[facility]  [address] [place], [place] [address] Phone: [phone] FAX: [place] [phone] 04/01/1980  CARE TEAM:  PCP: No PCP Per Patient  Outpatient Care Team: Patient Care Team: No Pcp Per Patient as PCP - General (General Practice)  Inpatient Treatment Team: Treatment Team: Attending Provider: Ena Dawley, MD; Chaplain: Benard Halsted, Chaplain; Registered Nurse: Bonney Aid, RN; Registered Nurse: Burnice Logan, RN; Registered Nurse: Elissa Lovett, RN; Registered Nurse: Donalda Ewings, RN; Consulting Physician: Nolon Nations, MD; Consulting Physician: Ian Bushman, MD   Subjective:  No major events Friend in room Sore right abdomen  Objective:  Vital signs:  Filed Vitals:   01/21/14 1739 01/21/14 2230 01/22/14 0156 01/22/14 0559  BP: 143/74 121/61 122/63 128/80  Pulse: 104 104 109 101  Temp: 99.4 F (37.4 C) 100.8 F (38.2 C) 99.1 F (37.3 C) 99.6 F (37.6 C)  TempSrc: Oral  Oral Oral  Resp: $Remo'19 20 20 20  'XnuCl$ Height:      Weight:    271 lb (122.925 kg)  SpO2:  96% 94% 96%    Last BM Date: 01/22/14  Intake/Output   Yesterday:  12/25 0701 - 12/26 0700 In: 74 [P.O.:960] Out: 2150 [Urine:2150] This shift:  Total I/O In: -  Out: 300 [Urine:300]  Bowel function:  Flatus: y  BM: n  Drain: n/a  Physical Exam:  General: Pt awake/alert/oriented x4 in no acute distress Eyes: PERRL, normal EOM.  Sclera clear.  No icterus Neuro: CN II-XII intact w/o focal sensory/motor deficits. Lymph: No head/neck/groin lymphadenopathy Psych:  No delerium/psychosis/paranoia HENT: Normocephalic, Mucus membranes moist.  No thrush Neck: Supple, No tracheal deviation Chest: No chest wall pain w good excursion CV:  Pulses intact.  Regular rhythm MS: Normal AROM mjr joints.  No obvious deformity Abdomen: Soft.  Nondistended.  Mildly tender at RUQ = epigastric > RLQ.  No evidence of peritonitis.   No incarcerated hernias.  No Murphy's sign Ext:  SCDs BLE.  No mjr edema.  No cyanosis Skin: No petechiae / purpura   Problem List:   Active Problems:   Morbid obesity   Diabetes mellitus type 2, uncontrolled   Endometritis   Abdominal pain - right side   Assessment  Nicole Cherry  33 y.o. female       Right sided abd pain of uncertain etiology  Plan:  -With atypical symptoms, no N/V & PO tolerance over the last week, no gallstones, and normal HIDA scan, very unlikely that GB etiology of problems.  No need for cholecystectomy -bowel regimen -doubt hepatitis, pancreatitis, GERD, ulcer, IBS, IBD.  Will try PPI BID.   ?gastric emptying study to r/o gastroparesis if HB & n/v given DM   ?Consult GI eval if UTI resolved & no help w antiinflammatory regimen >48hours from now -PO as tolerated.   -Tx possible MS etiology with ice/heat/antiinflammatories/robaxin  -improve DM control - per DM coordinator.  They rec mod SSI but I think w obesity would do resistant SSI & inc levimir -VTE prophylaxis- SCDs, etc -mobilize as tolerated to help recovery  The patient is stable.  There is no evidence of peritonitis, acute abdomen, nor shock.  There is no strong evidence of failure of improvement nor decline with current non-operative management.  There is no need for surgery at the present moment.  Signing off for now.  We will be available   Adin Hector, M.D., F.A.C.S. Gastrointestinal  and Minimally Invasive Surgery [facility] [address], [place], [place] [address] [phone] Main / Paging   01/22/2014   Results:   Labs: Results for orders placed or performed during the hospital encounter of 01/17/14 (from the past 48 hour(s))  Glucose, capillary     Status: Abnormal   Collection Time: 01/20/14  2:05 PM  Result Value Ref Range   Glucose-Capillary 216 (H) 70 - 99 mg/dL  Comprehensive metabolic panel     Status: Abnormal   Collection  Time: 01/20/14  4:15 PM  Result Value Ref Range   Sodium 138 135 - 145 mmol/L    Comment: Please note change in reference range.   Potassium 3.5 3.5 - 5.1 mmol/L    Comment: Please note change in reference range.   Chloride 98 96 - 112 mEq/L   CO2 33 (H) 19 - 32 mmol/L   Glucose, Bld 206 (H) 70 - 99 mg/dL   BUN 10 6 - 23 mg/dL   Creatinine, Ser 0.88 0.50 - 1.10 mg/dL   Calcium 8.4 8.4 - 10.5 mg/dL   Total Protein 7.2 6.0 - 8.3 g/dL   Albumin 2.9 (L) 3.5 - 5.2 g/dL   AST 20 0 - 37 U/L   ALT 18 0 - 35 U/L   Alkaline Phosphatase 93 39 - 117 U/L   Total Bilirubin 0.7 0.3 - 1.2 mg/dL   GFR calc non Af Amer 87 (L) >90 mL/min   GFR calc Af Amer >90 >90 mL/min    Comment: (NOTE) The eGFR has been calculated using the CKD EPI equation. This calculation has not been validated in all clinical situations. eGFR's persistently <90 mL/min signify possible Chronic Kidney Disease.    Anion gap 7 5 - 15  HIV antibody     Status: None   Collection Time: 01/20/14  4:15 PM  Result Value Ref Range   HIV 1&2 Ab, 4th Generation NONREACTIVE NONREACTIVE    Comment: (NOTE) A NONREACTIVE HIV Ag/Ab result does not exclude HIV infection since the time frame for seroconversion is variable. If acute HIV infection is suspected, a HIV-1 RNA Qualitative TMA test is recommended. HIV-1/2 Antibody Diff         Not indicated. HIV-1 RNA, Qual TMA           Not indicated. PLEASE NOTE: This information has been disclosed to you from records whose confidentiality may be protected by state law. If your state requires such protection, then the state law prohibits you from making any further disclosure of the information without the specific written consent of the person to whom it pertains, or as otherwise permitted by law. A general authorization for the release of medical or other information is NOT sufficient for this purpose. The performance of this assay has not been clinically validated in patients less than 41  years old. Performed at [facility]   Glucose, capillary     Status: Abnormal   Collection Time: 01/20/14  9:55 PM  Result Value Ref Range   Glucose-Capillary 189 (H) 70 - 99 mg/dL  CBC with Differential     Status: Abnormal   Collection Time: 01/21/14  7:06 AM  Result Value Ref Range   WBC 9.0 4.0 - 10.5 K/uL   RBC 3.45 (L) 3.87 - 5.11 MIL/uL   Hemoglobin 8.9 (L) 12.0 - 15.0 g/dL   HCT 27.8 (L) 36.0 - 46.0 %   MCV 80.6 78.0 - 100.0 fL  MCH 25.8 (L) 26.0 - 34.0 pg   MCHC 32.0 30.0 - 36.0 g/dL   RDW 15.7 (H) 11.5 - 15.5 %   Platelets 250 150 - 400 K/uL    Comment: REPEATED TO VERIFY DELTA CHECK NOTED SPECIMEN CHECKED FOR CLOTS    Neutrophils Relative % 78 (H) 43 - 77 %   Neutro Abs 7.0 1.7 - 7.7 K/uL   Lymphocytes Relative 12 12 - 46 %   Lymphs Abs 1.0 0.7 - 4.0 K/uL   Monocytes Relative 9 3 - 12 %   Monocytes Absolute 0.8 0.1 - 1.0 K/uL   Eosinophils Relative 1 0 - 5 %   Eosinophils Absolute 0.1 0.0 - 0.7 K/uL   Basophils Relative 0 0 - 1 %   Basophils Absolute 0.0 0.0 - 0.1 K/uL  Glucose, capillary     Status: Abnormal   Collection Time: 01/21/14  9:27 AM  Result Value Ref Range   Glucose-Capillary 154 (H) 70 - 99 mg/dL  Glucose, capillary     Status: Abnormal   Collection Time: 01/21/14  1:28 PM  Result Value Ref Range   Glucose-Capillary 116 (H) 70 - 99 mg/dL  Glucose, capillary     Status: None   Collection Time: 01/21/14  5:37 PM  Result Value Ref Range   Glucose-Capillary 92 70 - 99 mg/dL  Glucose, capillary     Status: Abnormal   Collection Time: 01/21/14  7:43 PM  Result Value Ref Range   Glucose-Capillary 117 (H) 70 - 99 mg/dL  Comprehensive metabolic panel     Status: Abnormal   Collection Time: 01/22/14  5:27 AM  Result Value Ref Range   Sodium 137 135 - 145 mmol/L    Comment: Please note change in reference range.   Potassium 4.0 3.5 - 5.1 mmol/L    Comment: Please note change in reference range.   Chloride 100 96 - 112 mEq/L   CO2 28 19  - 32 mmol/L   Glucose, Bld 232 (H) 70 - 99 mg/dL   BUN 11 6 - 23 mg/dL   Creatinine, Ser 0.81 0.50 - 1.10 mg/dL   Calcium 8.2 (L) 8.4 - 10.5 mg/dL   Total Protein 6.3 6.0 - 8.3 g/dL   Albumin 2.4 (L) 3.5 - 5.2 g/dL   AST 34 0 - 37 U/L   ALT 22 0 - 35 U/L   Alkaline Phosphatase 77 39 - 117 U/L   Total Bilirubin 0.9 0.3 - 1.2 mg/dL   GFR calc non Af Amer >90 >90 mL/min   GFR calc Af Amer >90 >90 mL/min    Comment: (NOTE) The eGFR has been calculated using the CKD EPI equation. This calculation has not been validated in all clinical situations. eGFR's persistently <90 mL/min signify possible Chronic Kidney Disease.    Anion gap 9 5 - 15  CBC with Differential     Status: Abnormal   Collection Time: 01/22/14  7:00 AM  Result Value Ref Range   WBC 11.0 (H) 4.0 - 10.5 K/uL   RBC 3.59 (L) 3.87 - 5.11 MIL/uL   Hemoglobin 9.3 (L) 12.0 - 15.0 g/dL   HCT 28.7 (L) 36.0 - 46.0 %   MCV 79.9 78.0 - 100.0 fL   MCH 25.9 (L) 26.0 - 34.0 pg   MCHC 32.4 30.0 - 36.0 g/dL   RDW 15.7 (H) 11.5 - 15.5 %   Platelets 236 150 - 400 K/uL    Comment: SPECIMEN CHECKED FOR CLOTS REPEATED TO VERIFY  PLATELET COUNT CONFIRMED BY SMEAR    Neutrophils Relative % 75 43 - 77 %   Neutro Abs 8.2 (H) 1.7 - 7.7 K/uL   Lymphocytes Relative 17 12 - 46 %   Lymphs Abs 1.8 0.7 - 4.0 K/uL   Monocytes Relative 7 3 - 12 %   Monocytes Absolute 0.8 0.1 - 1.0 K/uL   Eosinophils Relative 1 0 - 5 %   Eosinophils Absolute 0.1 0.0 - 0.7 K/uL   Basophils Relative 0 0 - 1 %   Basophils Absolute 0.0 0.0 - 0.1 K/uL  Lipase, blood     Status: None   Collection Time: 01/22/14  7:00 AM  Result Value Ref Range   Lipase 28 11 - 59 U/L    Comment: Performed at [facility]    Imaging / Studies: Nm Hepatobiliary Including Gb  01/21/2014   CLINICAL DATA:  Right upper quadrant pain. Concern for acute cholecystitis.  EXAM: NUCLEAR MEDICINE HEPATOBILIARY IMAGING  TECHNIQUE: Sequential images of the abdomen were obtained out to  60 minutes following intravenous administration of radiopharmaceutical.  RADIOPHARMACEUTICALS:  5.3 [place] MC-94B Choletec  COMPARISON:  CT 01/17/2014  FINDINGS: There is prompt clearance of radiotracer from the blood pool and homogeneous uptake within the liver. Counts are present within the bowel by 20 min. The gallbladder begins to fill at 45 min.  IMPRESSION: 1. Patent cystic duct and common bile duct. 2. No scintigraphic evidence of cholecystitis.   Electronically Signed   By: Suzy Bouchard M.D.   On: 01/21/2014 21:52   US Abdomen Limited  01/21/2014   CLINICAL DATA:  Right upper quadrant pain for 2 weeks  EXAM: US ABDOMEN LIMITED - RIGHT UPPER QUADRANT  COMPARISON:  CT scan 01/17/2014.  FINDINGS: Gallbladder:  No gallstones are noted within gallbladder. Borderline thickening gallbladder wall up to 3 mm. There is positive sonographic Murphy's sign. Clinical correlation is necessary to exclude early cholecystitis.  Common bile duct:  Diameter: 5 mm in diameter within normal limits.  Liver:  No focal lesion identified. Within normal limits in parenchymal echogenicity.  IMPRESSION: 1. No gallstones are noted within gallbladder. There is positive sonographic Murphy's sign with borderline thickening of gallbladder wall up to 3 mm. Clinical correlation is necessary to exclude early cholecystitis. Normal CBD.   Electronically Signed   By: Lahoma Crocker M.D.   On: 01/21/2014 09:04    Medications / Allergies: per chart  Antibiotics: Anti-infectives    Start     Dose/Rate Route Frequency Ordered Stop   01/21/14 1600  doxycycline (VIBRAMYCIN) 100 mg in dextrose 5 % 250 mL IVPB     100 mg125 mL/hr over 120 Minutes Intravenous Every 12 hours 01/21/14 1515     01/20/14 1600  doxycycline (VIBRA-TABS) tablet 100 mg  Status:  Discontinued     100 mg Oral Every 12 hours 01/20/14 1501 01/21/14 1515   01/20/14 1530  Ampicillin-Sulbactam (UNASYN) 3 g in sodium chloride 0.9 % 100 mL IVPB     3 g100 mL/hr over 60  Minutes Intravenous Every 6 hours 01/20/14 1501     01/20/14 0900  gentamicin (GARAMYCIN) 150 mg, clindamycin (CLEOCIN) 900 mg in dextrose 5 % 100 mL IVPB  Status:  Discontinued     219.5 mL/hr over 30 Minutes Intravenous Every 8 hours 01/20/14 0756 01/20/14 1501   01/20/14 0800  ampicillin (OMNIPEN) 1 g in sodium chloride 0.9 % 50 mL IVPB  Status:  Discontinued     1 g150 mL/hr  over 20 Minutes Intravenous 4 times per day 01/20/14 0746 01/20/14 1501   01/20/14 0800  clindamycin (CLEOCIN) IVPB 900 mg  Status:  Discontinued     900 mg100 mL/hr over 30 Minutes Intravenous 3 times per day 01/20/14 0746 01/20/14 0756   01/20/14 0800  gentamicin (GARAMYCIN) 240 mg in dextrose 5 % 50 mL IVPB  Status:  Discontinued     2 mg/kg  119.3 kg112 mL/hr over 30 Minutes Intravenous Every 8 hours 01/20/14 0746 01/20/14 0757   01/17/14 2200  doxycycline (VIBRA-TABS) tablet 100 mg  Status:  Discontinued     100 mg Oral Every 12 hours 01/17/14 2149 01/18/14 1357   01/17/14 2200  levofloxacin (LEVAQUIN) IVPB 500 mg  Status:  Discontinued     500 mg100 mL/hr over 60 Minutes Intravenous Every 24 hours 01/17/14 2149 01/20/14 0746   01/17/14 1845  levofloxacin (LEVAQUIN) IVPB 500 mg  Status:  Discontinued     500 mg100 mL/hr over 60 Minutes Intravenous  Once 01/17/14 1842 01/17/14 2149       Note: Portions of this report may have been transcribed using voice recognition software. Every effort was made to ensure accuracy; however, inadvertent computerized transcription errors may be present.   Any transcriptional errors that result from this process are unintentional.

## 2014-01-22 NOTE — Progress Notes (Signed)
CBG 82 before dinner.

## 2014-01-22 NOTE — Progress Notes (Signed)
*   No surgery found *  Subjective: Patient reports tolerating PO.  She said her pain is better.  She has a sore throat.   Objective: I have reviewed patient's vital signs, intake and output and labs. BP 112/69 mmHg  Pulse 96  Temp(Src) 98.7 F (37.1 C) (Oral)  Resp 18  Ht 5\' 1"  (1.549 m)  Wt 122.925 kg (271 lb)  BMI 51.23 kg/m2  SpO2 98%  LMP 12/31/2013   General: alert and cooperative Resp: clear to auscultation bilaterally Cardio: regular rate and rhythm GI: soft, non-tender; bowel sounds normal; no masses,  no organomegaly Extremities: extremities normal, atraumatic, no cyanosis or edema  Throat.  No erythema noted just some white coating on the tongue.   Assessment: S/p in office ablation 12/31/2013 stable and post-op fever  Plan: PT states she feels better.  Her temperature is down and no longer has a feve. ID changed abx to rocephin  Pt has some SOB and swelling.  Will d/c IVF.  cxr no pulmonary edema seen.  Pulse ox is normal.  Will encourage IS and ambulation GB appears normal WBC improving. Continue care with IV abx until pt no loner febrile  For 24-48 hours.   Pt also c/o sore throat.  She was given mouth wash and it felt better.  No evidence of infection on exam  LOS: 5 days    Kamesha Herne A 01/22/2014, 6:33 PM

## 2014-01-22 NOTE — Progress Notes (Signed)
IM team consulted for DM management. Pt currently on Levemir 15 U, along with SSI resistant coverage and meal coverage. Continue same regimen. Appreciate diabetic educator input. Please follow the recommendations of diabetic educator. Will sign off but please call us with additional questions.   Debbora PrestoMAGICK-Esther Broyles, MD  [facility] Pager [phone] Cell  [phone]   If 7PM-7AM, please contact night-coverage [email or web address] Password [email or web address]

## 2014-01-22 NOTE — Progress Notes (Signed)
Echo will be preformed in morning 12/27.  Osvaldo AngstK. Makeba Delcastillo, RN--------------

## 2014-01-22 NOTE — Progress Notes (Signed)
Pt did not inform nurse she ate lunch.  CBG one hour s/p meal 220.  In formed Dr. Verta EllenMagick-Myers, instructed to use same sliding scale for insulin coverage.  Will continue to monitor.  Osvaldo AngstK. Baelynn Schmuhl, RN--------------------

## 2014-01-22 NOTE — Progress Notes (Signed)
CBG 169, before breakfast.

## 2014-01-23 ENCOUNTER — Other Ambulatory Visit (HOSPITAL_COMMUNITY): Payer: Self-pay | Admitting: *Deleted

## 2014-01-23 DIAGNOSIS — A491 Streptococcal infection, unspecified site: Secondary | ICD-10-CM

## 2014-01-23 DIAGNOSIS — R509 Fever, unspecified: Secondary | ICD-10-CM

## 2014-01-23 DIAGNOSIS — R1013 Epigastric pain: Secondary | ICD-10-CM | POA: Insufficient documentation

## 2014-01-23 LAB — CBC WITH DIFFERENTIAL/PLATELET
Basophils Absolute: 0.1 10*3/uL (ref 0.0–0.1)
Basophils Relative: 1 % (ref 0–1)
EOS ABS: 0.1 10*3/uL (ref 0.0–0.7)
Eosinophils Relative: 1 % (ref 0–5)
HCT: 27.2 % — ABNORMAL LOW (ref 36.0–46.0)
Hemoglobin: 8.9 g/dL — ABNORMAL LOW (ref 12.0–15.0)
LYMPHS ABS: 1.6 10*3/uL (ref 0.7–4.0)
Lymphocytes Relative: 16 % (ref 12–46)
MCH: 26 pg (ref 26.0–34.0)
MCHC: 32.7 g/dL (ref 30.0–36.0)
MCV: 79.5 fL (ref 78.0–100.0)
Monocytes Absolute: 1.1 10*3/uL — ABNORMAL HIGH (ref 0.1–1.0)
Monocytes Relative: 11 % (ref 3–12)
NEUTROS ABS: 7.2 10*3/uL (ref 1.7–7.7)
NEUTROS PCT: 72 % (ref 43–77)
PLATELETS: 302 10*3/uL (ref 150–400)
RBC: 3.42 MIL/uL — ABNORMAL LOW (ref 3.87–5.11)
RDW: 16 % — ABNORMAL HIGH (ref 11.5–15.5)
WBC: 10 10*3/uL (ref 4.0–10.5)

## 2014-01-23 MED ORDER — HYDROCERIN EX CREA
TOPICAL_CREAM | Freq: Three times a day (TID) | CUTANEOUS | Status: DC
  Administered 2014-01-23 – 2014-01-25 (×7): via TOPICAL
  Filled 2014-01-23 (×2): qty 113

## 2014-01-23 MED ORDER — VALACYCLOVIR HCL 500 MG PO TABS
1000.0000 mg | ORAL_TABLET | Freq: Two times a day (BID) | ORAL | Status: DC
  Administered 2014-01-23 – 2014-01-25 (×5): 1000 mg via ORAL
  Filled 2014-01-23 (×8): qty 2

## 2014-01-23 MED ORDER — VALACYCLOVIR HCL 500 MG PO TABS: 500.0000 mg | ORAL_TABLET | Freq: Two times a day (BID) | ORAL | Status: DC

## 2014-01-23 NOTE — Progress Notes (Signed)
CBG 132 prior to lunch.  MD notified per patient request.  Order to give insulin based on previous set sliding scale.  Will continue to monitor.  Osvaldo AngstK. Shray Hunley, RN---------------------------------

## 2014-01-23 NOTE — Progress Notes (Addendum)
  Hospital Day 7.   Subjective: Patient reports tolerating PO, + BM and no problems voiding.  Continues with right-sided upper abdominal pain that less intensity than before. She had shortness of breath while lying flat during the night but no SOB this morning. Ambulating without difficulty. Denies fevers or chills or unusual vaginal discharge    Objective: I have reviewed patient's vital signs. Filed Vitals:   01/22/14 2152 01/22/14 2300 01/23/14 0212 01/23/14 0506  BP: 131/74  112/75 119/57  Pulse: 93 93 94 87  Temp: 98.6 F (37 C)  98.9 F (37.2 C) 98.8 F (37.1 C)  TempSrc: Oral  Oral Oral  Resp: 19 18 18 20   Height:      Weight:    271 lb 4 oz (123.038 kg)  SpO2: 99% 100% 98% 98%   Last fever 01/21/14@ 2230 100.8  Gen: NAD CVS: s1, S2, RR, 2/6 systolic ejection murmur. Pulm: CTAB Abd: S/ Tender to palpation in right mid and upper quadrants, no rebound, no guarding.  No tenderness in lower abdomen or left side  Ext: Warm and well purfused, no edema, no calftenderness.   CBC    Component Value Date/Time   WBC 10.0 01/23/2014 0932   RBC 3.42* 01/23/2014 0932   HGB 8.9* 01/23/2014 0932   HCT 27.2* 01/23/2014 0932   PLT 302 01/23/2014 0932   MCV 79.5 01/23/2014 0932   MCH 26.0 01/23/2014 0932   MCHC 32.7 01/23/2014 0932   RDW 16.0* 01/23/2014 0932   LYMPHSABS 1.6 01/23/2014 0932   MONOABS 1.1* 01/23/2014 0932   EOSABS 0.1 01/23/2014 0932   BASOSABS 0.1 01/23/2014 0932     CBC Latest Ref Rng 01/22/2014 01/21/2014 01/20/2014  WBC 4.0 - 10.5 K/uL 11.0(H) 9.0 13.4(H)  Hemoglobin 12.0 - 15.0 g/dL 4.0(J9.3(L) 8.9(L) 10.3(L)  Hematocrit 36.0 - 46.0 % 28.7(L) 27.8(L) 32.0(L)  Platelets 150 - 400 K/uL 236 250 330   Chest x-ray 12/26:  IMPRESSION: No pulmonary edema. Streaky right base medially atelectasis or infiltrate.  FSG from 01/22/14: 169/220/161/82/123   Assessment/Plan: 33 year old female admitted with abdominal pain, fevers, with diagnosis of endometritis, on  antibiotics, with diabetes mellitus, stable  -RUQ pain s/p workup and evaluation by general surgery thought to be possibly muskuloskeletal in origin.  Pain meds prn -Continue with ceftriaxone as per ID. -Continue with Levemir as per hospitalist. - cardiac echo as per hospitalist.  -Final blood cultures pending.  Unit floor time 40 minutes.   [facility] Hospital[facility]UL[facility],Vane Yapp [facility] Center[facility]AKURU 01/23/2014, 9:04 AM

## 2014-01-23 NOTE — Progress Notes (Signed)
Dr. Sallye OberKulwa dose not want insulin administered.  Will continue to monitor.  Osvaldo AngstK. Lorae Roig, RN-----------

## 2014-01-23 NOTE — Progress Notes (Addendum)
[facility]     Day #2 rocephin  3 days of  unasyn 3 days of inpatient  doxy but also 10 days of outpatient doxy 2 days of inpatient levaquin  Subjective: Still c/o RUQ pain, orthopenea frustrated that no clear cut diagnosis has been made  She also has sore under her nose and within nose adn inside of mouth  Antibiotics:  Anti-infectives    Start     Dose/Rate Route Frequency Ordered Stop   01/22/14 1630  cefTRIAXone (ROCEPHIN) 2 g in dextrose 5 % 50 mL IVPB - Premix     2 g100 mL/hr over 30 Minutes Intravenous Every 24 hours 01/22/14 1607     01/21/14 1600  doxycycline (VIBRAMYCIN) 100 mg in dextrose 5 % 250 mL IVPB  Status:  Discontinued     100 mg125 mL/hr over 120 Minutes Intravenous Every 12 hours 01/21/14 1515 01/22/14 1607   01/20/14 1600  doxycycline (VIBRA-TABS) tablet 100 mg  Status:  Discontinued     100 mg Oral Every 12 hours 01/20/14 1501 01/21/14 1515   01/20/14 1530  Ampicillin-Sulbactam (UNASYN) 3 g in sodium chloride 0.9 % 100 mL IVPB  Status:  Discontinued     3 g100 mL/hr over 60 Minutes Intravenous Every 6 hours 01/20/14 1501 01/22/14 1607   01/20/14 0900  gentamicin (GARAMYCIN) 150 mg, clindamycin (CLEOCIN) 900 mg in dextrose 5 % 100 mL IVPB  Status:  Discontinued     219.5 mL/hr over 30 Minutes Intravenous Every 8 hours 01/20/14 0756 01/20/14 1501   01/20/14 0800  ampicillin (OMNIPEN) 1 g in sodium chloride 0.9 % 50 mL IVPB  Status:  Discontinued     1 g150 mL/hr over 20 Minutes Intravenous 4 times per day 01/20/14 0746 01/20/14 1501   01/20/14 0800  clindamycin (CLEOCIN) IVPB 900 mg  Status:  Discontinued     900 mg100 mL/hr over 30 Minutes Intravenous 3 times per day 01/20/14 0746 01/20/14 0756   01/20/14 0800  gentamicin (GARAMYCIN) 240 mg in dextrose 5 % 50 mL IVPB  Status:  Discontinued     2 mg/kg  119.3 kg112 mL/hr over 30 Minutes Intravenous Every 8 hours 01/20/14 0746 01/20/14 0757   01/17/14 2200  doxycycline  (VIBRA-TABS) tablet 100 mg  Status:  Discontinued     100 mg Oral Every 12 hours 01/17/14 2149 01/18/14 1357   01/17/14 2200  levofloxacin (LEVAQUIN) IVPB 500 mg  Status:  Discontinued     500 mg100 mL/hr over 60 Minutes Intravenous Every 24 hours 01/17/14 2149 01/20/14 0746   01/17/14 1845  levofloxacin (LEVAQUIN) IVPB 500 mg  Status:  Discontinued     500 mg100 mL/hr over 60 Minutes Intravenous  Once 01/17/14 1842 01/17/14 2149      Medications: Scheduled Meds: . cefTRIAXone (ROCEPHIN)  IV  2 g Intravenous Q24H  . hydrocerin   Topical TID  . Influenza vac split quadrivalent PF  0.5 mL Intramuscular Tomorrow-1000  . insulin aspart  0-20 Units Subcutaneous TID WC  . insulin aspart  0-5 Units Subcutaneous QHS  . insulin detemir  15 Units Subcutaneous q morning - 10a  . lip balm  1 application Topical BID  . naproxen sodium  550 mg Oral BID WC  . pantoprazole  40 mg Oral BID AC  . polyethylene glycol  17 g Oral BID  . sodium chloride  3 mL Intravenous Q12H   Continuous Infusions:  PRN Meds:.sodium chloride, acetaminophen, acetaminophen, alum &  mag hydroxide-simeth, bisacodyl, guaiFENesin, HYDROmorphone, magic mouthwash, menthol-cetylpyridinium, methocarbamol (ROBAXIN)  IV, methocarbamol, ondansetron **OR** ondansetron (ZOFRAN) IV, phenol, simethicone, sodium chloride    Objective: Weight change: 4 oz (0.113 kg)  Intake/Output Summary (Last 24 hours) at 01/23/14 1630 Last data filed at 01/23/14 1100  Gross per 24 hour  Intake    720 ml  Output    650 ml  Net     70 ml   Blood pressure 138/73, pulse 95, temperature 98.7 F (37.1 C), temperature source Oral, resp. rate 18, height 5\' 1"  (1.549 m), weight 271 lb 4 oz (123.038 kg), last menstrual period 12/31/2013, SpO2 93 %. Temp:  [98.6 F (37 C)-98.9 F (37.2 C)] 98.7 F (37.1 C) (12/27 1200) Pulse Rate:  [87-96] 95 (12/27 1200) Resp:  [18-20] 18 (12/27 1200) BP: (112-138)/(57-75) 138/73 mmHg (12/27 1200) SpO2:  [93 %-100 %]  93 % (12/27 1200) Weight:  [271 lb 4 oz (123.038 kg)] 271 lb 4 oz (123.038 kg) (12/27 0506)  Physical Exam: General: Alert and awake, oriented x3, not in any acute distress. HEENT: anicteric sclera, pupils reactive to light and accommodation,       CVS regular rate, normal Chest:  no wheezing, rales or rhonchi Abdomen: soft tender to palpation greatest in RUQ Extremities: no  clubbing or edema noted bilaterally  Skin: no rashes  Neuro: nonfocal  CBC:  CBC Latest Ref Rng 01/23/2014 01/22/2014 01/21/2014  WBC 4.0 - 10.5 K/uL 10.0 11.0(H) 9.0  Hemoglobin 12.0 - 15.0 g/dL 2.1(H) 0.8(M) 8.9(L)  Hematocrit 36.0 - 46.0 % 27.2(L) 28.7(L) 27.8(L)  Platelets 150 - 400 K/uL 302 236 250     BMET  Recent Labs  01/22/14 0527  NA 137  K 4.0  CL 100  CO2 28  GLUCOSE 232*  BUN 11  CREATININE 0.81  CALCIUM 8.2*     Liver Panel   Recent Labs  01/22/14 0527  PROT 6.3  ALBUMIN 2.4*  AST 34  ALT 22  ALKPHOS 77  BILITOT 0.9       Sedimentation Rate No results for input(s): ESRSEDRATE in the last 72 hours. C-Reactive Protein No results for input(s): CRP in the last 72 hours.  Micro Results: Recent Results (from the past 720 hour(s))  GC/Chlamydia Probe Amp     Status: None   Collection Time: 01/13/14 10:50 AM  Result Value Ref Range Status   CT Probe RNA NEGATIVE NEGATIVE Final   GC Probe RNA NEGATIVE NEGATIVE Final    Comment: (NOTE)                                                                                       **Normal Reference Range: Negative**      Assay performed using the Gen-Probe APTIMA COMBO2 (R) Assay. Acceptable specimen types for this assay include APTIMA Swabs (Unisex, endocervical, urethral, or vaginal), first void urine, and ThinPrep liquid based cytology samples. Performed at [facility]   Urine culture     Status: None   Collection Time: 01/17/14  5:17 PM  Result Value Ref Range Status   Specimen Description URINE, CLEAN  CATCH  Final   Special Requests  NONE  Final   Culture  Setup Time   Final    01/18/2014 04:24 Performed at [facility]    Colony Count   Final    80,000 COLONIES/ML Performed at [facility]    Culture   Final    STREPTOCOCCUS GROUP D;high probability for S.bovis Performed at [facility]    Report Status 01/19/2014 FINAL  Final   Organism ID, Bacteria STREPTOCOCCUS GROUP D;high probability for S.bovis  Final      Susceptibility   Streptococcus group d;high probability for s.bovis - MIC (ETEST)*    PENICILLIN <=0.125 SENSITIVE Sensitive     * STREPTOCOCCUS GROUP D;high probability for S.bovis  Culture, blood (routine x 2)     Status: None (Preliminary result)   Collection Time: 01/20/14  8:20 AM  Result Value Ref Range Status   Specimen Description BLOOD RIGHT ARM  Final   Special Requests BOTTLES DRAWN AEROBIC ONLY 5CC EACH BOTTLE  Final   Culture   Final           BLOOD CULTURE RECEIVED NO GROWTH TO DATE CULTURE WILL BE HELD FOR 5 DAYS BEFORE ISSUING A FINAL NEGATIVE REPORT Performed at [facility]    Report Status PENDING  Incomplete    Studies/Results: Dg Chest 2 View  01/22/2014   CLINICAL DATA:  Shortness of breath when lying down  EXAM: CHEST  2 VIEW  COMPARISON:  None.  FINDINGS: Cardiomediastinal silhouette is unremarkable. Mild elevation of the right hemidiaphragm. Streaky right base medially atelectasis or infiltrate. No pulmonary edema. No pleural effusion.  IMPRESSION: No pulmonary edema. Streaky right base medially atelectasis or infiltrate.   Electronically Signed   By: Natasha Mead M.D.   On: 01/22/2014 16:36   Nm Hepatobiliary Including Gb  01/21/2014   CLINICAL DATA:  Right upper quadrant pain. Concern for acute cholecystitis.  EXAM: NUCLEAR MEDICINE HEPATOBILIARY IMAGING  TECHNIQUE: Sequential images of the abdomen were obtained out to 60 minutes following intravenous administration of radiopharmaceutical.   RADIOPHARMACEUTICALS:  5.3 Millicurie Tc-21m Choletec  COMPARISON:  CT 01/17/2014  FINDINGS: There is prompt clearance of radiotracer from the blood pool and homogeneous uptake within the liver. Counts are present within the bowel by 20 min. The gallbladder begins to fill at 45 min.  IMPRESSION: 1. Patent cystic duct and common bile duct. 2. No scintigraphic evidence of cholecystitis.   Electronically Signed   By: Genevive Bi M.D.   On: 01/21/2014 21:52      Assessment/Plan:  Active Problems:   Morbid obesity   Diabetes mellitus type 2, uncontrolled   Endometritis   Abdominal pain - right side   FUO (fever of unknown origin)   Streptococcus bovis infection    Addison J Simkin is a 33 y.o. female with  Recent endometrial ablation forheavy menses who underwent endometrium ablation on January 01, 2012 withhysteroscopy confirmed that there was no perforation. Her post-procedure complication has mostly been abdominal discomfort leading to numerous medical visits. She had transvaginal ultrasound on 12/17 and no masses or abscesses were appreciated. The patient received an empiric course of ciprofloxacin, and doxycycline. She was admitted on 12/21 due to increasing abdominal pain localizing now to RUQ nad RLQ pain and leukocytosis of 13K with tachycardia. Her SIRS presentation led her to be admitted to [facility] for IV antibiotics. Infectious work up showed ua some pyuria, ua showed 80,000 strep bovis, gc/chlamydia is negative. abd CT on admit did now show any abn to explain abdominal  pain or leukocytosis. She was initiated on levofloxacin plus doxycycline for 3 days with no improvement in wbc. She had temp to 103 last night, which prompted drawing blood cx and change in antibiotics to ampicillin, gent and clindamycin.She was seen by Dr Drue SecondSnider who simplified to unasyn and doxy. She has had thorough evaluation for biliary pathology but has had normal imaging including HIDA scan   #1 Fevers: My concerns  is that she may have had an occult S bovis bacteremia (that was not diagnosed due to no blood cultures having been done until AFTER antibiotics already started)   While bacteremia with this pathogen has high association with GI pathology and in partiular colonic pathology, colon cancer this patient has unremarkable CT imaging findings.   I did also find a case report of S bovis that occcurred in association with endometrial cancer and so I imagine it can also occur as complication of endometritis.   I am worried that in addition to occult bacteremia with S bovis that we only glimpsed via a urine culture that s  TTE did not reveal evidence of vegetations but I still remain concerned for this possibility. I was able to find out that the patient DID have another urine culture earlier this month in which she grew Proteus. IF she were to have also grown S bovis then that might be more compelling evidence for this diagnosis  I had presented the patient (and husband and mother were present) with options of pursuing   A) TEE understanding there would be risk of esophageal perforation, intubation etc vs benefit of finding a diagnosis  B) Empiric IV therapy for endocarditis with IV rocephin for 4 weeks, benefit of IV antibiotics (over oral) and  of not having to have TEE done vs risks of PICC line etc  C) Try to go from current IV antibiotics to an oral regimen such as augmentin finish two week course of antibiotics and then check surveillance cultures  The patient discussed with family and wanted to pursue TEE  I discussed with Viann FishSpencer Tilley, MD who was on call for Cardiology. He told me best option is to call Trish in am with "Card master" to have them evaluate her for TEE but he FELT in his opinion he would not be eager to pursue TEE in this patient given her body habitus, possibility of undiagnosed OSA, obesity hypoventilation etc  I spent greater than 60 minutes with the patient including greater  than 50% of time in face to face counsel of the patient and in coordination of their care.  I will discuss further with the patient tomorrow.   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 --will start valtrex 1 g bid   LOS: 6 days   Acey LavCornelius Van Dam 01/23/2014, 4:30 PM

## 2014-01-23 NOTE — Progress Notes (Signed)
CBG before dinner 161.   One touch machine reads ID stickers as   unreadable .  Called Link [phone] as advised and reported problem.

## 2014-01-23 NOTE — Progress Notes (Addendum)
Patient refused insulin at this time.  Requesting to speak with MD.  MD called per patient request.  Patient and family also requesting to speak with house coverage RN.  House coverage RN notified and en route per patient request.  Will continue to monitor.  Osvaldo AngstK. Sunaina Ferrando, RN---------------------------

## 2014-01-23 NOTE — Progress Notes (Signed)
Bedtime CBG=141, no coverage per sliding scale.

## 2014-01-23 NOTE — Progress Notes (Signed)
  Echocardiogram 2D Echocardiogram has been performed.  Nicole Cherry, Nicole Cherry A 01/23/2014, 9:22 AM

## 2014-01-23 NOTE — Progress Notes (Signed)
Fasting CBG 123.  Osvaldo AngstK. Shakala Marlatt, RN---------------------

## 2014-01-24 ENCOUNTER — Telehealth: Payer: Self-pay | Admitting: General Practice

## 2014-01-24 DIAGNOSIS — R1013 Epigastric pain: Secondary | ICD-10-CM

## 2014-01-24 LAB — CBC WITH DIFFERENTIAL/PLATELET
Basophils Absolute: 0.1 10*3/uL (ref 0.0–0.1)
Basophils Relative: 1 % (ref 0–1)
Eosinophils Absolute: 0.2 10*3/uL (ref 0.0–0.7)
Eosinophils Relative: 2 % (ref 0–5)
HEMATOCRIT: 26 % — AB (ref 36.0–46.0)
Hemoglobin: 8.6 g/dL — ABNORMAL LOW (ref 12.0–15.0)
LYMPHS PCT: 23 % (ref 12–46)
Lymphs Abs: 2.1 10*3/uL (ref 0.7–4.0)
MCH: 26.4 pg (ref 26.0–34.0)
MCHC: 33.1 g/dL (ref 30.0–36.0)
MCV: 79.8 fL (ref 78.0–100.0)
MONO ABS: 0.6 10*3/uL (ref 0.1–1.0)
Monocytes Relative: 7 % (ref 3–12)
NEUTROS ABS: 5.9 10*3/uL (ref 1.7–7.7)
Neutrophils Relative %: 67 % (ref 43–77)
Platelets: 304 10*3/uL (ref 150–400)
RBC: 3.26 MIL/uL — AB (ref 3.87–5.11)
RDW: 15.7 % — ABNORMAL HIGH (ref 11.5–15.5)
WBC: 8.8 10*3/uL (ref 4.0–10.5)

## 2014-01-24 LAB — GLUCOSE, CAPILLARY
GLUCOSE-CAPILLARY: 116 mg/dL — AB (ref 70–99)
GLUCOSE-CAPILLARY: 123 mg/dL — AB (ref 70–99)
GLUCOSE-CAPILLARY: 139 mg/dL — AB (ref 70–99)
GLUCOSE-CAPILLARY: 141 mg/dL — AB (ref 70–99)
GLUCOSE-CAPILLARY: 158 mg/dL — AB (ref 70–99)
GLUCOSE-CAPILLARY: 164 mg/dL — AB (ref 70–99)
GLUCOSE-CAPILLARY: 165 mg/dL — AB (ref 70–99)
GLUCOSE-CAPILLARY: 169 mg/dL — AB (ref 70–99)
Glucose-Capillary: 202 mg/dL — ABNORMAL HIGH (ref 70–99)
Glucose-Capillary: 220 mg/dL — ABNORMAL HIGH (ref 70–99)
Glucose-Capillary: 82 mg/dL (ref 70–99)
Glucose-Capillary: 161 mg/dL — ABNORMAL HIGH (ref 70–99)
Glucose-Capillary: 134 mg/dL — ABNORMAL HIGH (ref 70–99)
Glucose-Capillary: 127 mg/dL — ABNORMAL HIGH (ref 70–99)
Glucose-Capillary: 140 mg/dL — ABNORMAL HIGH (ref 70–99)

## 2014-01-24 MED ORDER — SODIUM CHLORIDE 0.9 % IV SOLN
INTRAVENOUS | Status: DC
  Administered 2014-01-25: via INTRAVENOUS

## 2014-01-24 NOTE — Progress Notes (Signed)
01/23/14 2123 HS CBG 165 prior to meal.No HS coverage until glucose 201.

## 2014-01-24 NOTE — Progress Notes (Signed)
Pt was sitting up in bed when I arrived. Pt's husband and mother-in-law were bedside. Pt said she was ok but ready to go home. She said they just can't figure out what's going on. I listened as pt described how long she has been here. Pt was very pleasant and cheerful in spite of her extended hospital stay. Had prayer w/pt and family. They were very thankful for visit and prayer. Marjory Liesamela Carrington Holder Chaplain   01/24/14 1400  Clinical Encounter Type  Visited With Patient and family together

## 2014-01-24 NOTE — Progress Notes (Signed)
TEE requested by ID, Dr Elease HashimotoNahser discussed case with ID and agrees to proceed. Pt is scheduled for 2pm on 01/25/14 @ 1400 at [facility].  Vesta MixerPhilip J. Nahser, Montez HagemanJr., MD, [facility] 01/25/2014, 2:30 PM [address],  [address] Office [phone]- [phone] Pager [phone]

## 2014-01-24 NOTE — Progress Notes (Signed)
Subjective: Patient reports tolerating PO.    Objective: I have reviewed patient's vital signs, intake and output, labs and microbiology.  General: alert and cooperative Resp: clear to auscultation bilaterally Cardio: regular rate and rhythm, S1, S2 normal, no murmur, click, rub or gallop GI: soft, non-tender; bowel sounds normal; no masses,  no organomegaly Extremities: extremities normal, atraumatic, no cyanosis or edema Vaginal Bleeding: none   Assessment/Plan: Endometritis now afebrile on Rocephin Pt for TEE tomorrow to r/o endocarditis Continue current care   LOS: 7 days    Buel Molder A 01/24/2014, 5:07 PM

## 2014-01-24 NOTE — Telephone Encounter (Signed)
[facility] message-not an active pt of endo-Dr. Stefano GaulStringer called Caller states that he is Dr. Kirkland HunArthur Stringer, an OB GYN. Return phone # is [phone]. He is calling to speak to the on call endocrinologist. He was transferred to us. The pt has diabetes, her sugar is out of control, and she has an infection. Caller is OB/GYN and patient is in hospital and they need a consult. Needs to be seen tomorrow. Blood sugar is out of control. States the consult has been ordered. States he is just notifying of consult. Does not need a call back.

## 2014-01-24 NOTE — Progress Notes (Signed)
[facility]     Day #3 rocephin  3 days of  unasyn 3 days of inpatient  doxy but also 10 days of outpatient doxy 2 days of inpatient levaquin  Subjective: Still c/o RUQ pain, orthopenea frustrated that no clear cut diagnosis has been made  She also has sore under her nose and within nose adn inside of mouth that are improved after starting valtrex  Antibiotics:  Anti-infectives    Start     Dose/Rate Route Frequency Ordered Stop   01/23/14 1800  valACYclovir (VALTREX) tablet 1,000 mg     1,000 mg Oral 2 times daily 01/23/14 1727     01/23/14 1727  valACYclovir (VALTREX) tablet 500 mg  Status:  Discontinued     500 mg Oral 2 times daily 01/23/14 1727 01/23/14 1727   01/22/14 1630  cefTRIAXone (ROCEPHIN) 2 g in dextrose 5 % 50 mL IVPB - Premix     2 g100 mL/hr over 30 Minutes Intravenous Every 24 hours 01/22/14 1607     01/21/14 1600  doxycycline (VIBRAMYCIN) 100 mg in dextrose 5 % 250 mL IVPB  Status:  Discontinued     100 mg125 mL/hr over 120 Minutes Intravenous Every 12 hours 01/21/14 1515 01/22/14 1607   01/20/14 1600  doxycycline (VIBRA-TABS) tablet 100 mg  Status:  Discontinued     100 mg Oral Every 12 hours 01/20/14 1501 01/21/14 1515   01/20/14 1530  Ampicillin-Sulbactam (UNASYN) 3 g in sodium chloride 0.9 % 100 mL IVPB  Status:  Discontinued     3 g100 mL/hr over 60 Minutes Intravenous Every 6 hours 01/20/14 1501 01/22/14 1607   01/20/14 0900  gentamicin (GARAMYCIN) 150 mg, clindamycin (CLEOCIN) 900 mg in dextrose 5 % 100 mL IVPB  Status:  Discontinued     219.5 mL/hr over 30 Minutes Intravenous Every 8 hours 01/20/14 0756 01/20/14 1501   01/20/14 0800  ampicillin (OMNIPEN) 1 g in sodium chloride 0.9 % 50 mL IVPB  Status:  Discontinued     1 g150 mL/hr over 20 Minutes Intravenous 4 times per day 01/20/14 0746 01/20/14 1501   01/20/14 0800  clindamycin (CLEOCIN) IVPB 900 mg  Status:  Discontinued     900 mg100 mL/hr over 30 Minutes Intravenous  3 times per day 01/20/14 0746 01/20/14 0756   01/20/14 0800  gentamicin (GARAMYCIN) 240 mg in dextrose 5 % 50 mL IVPB  Status:  Discontinued     2 mg/kg  119.3 kg112 mL/hr over 30 Minutes Intravenous Every 8 hours 01/20/14 0746 01/20/14 0757   01/17/14 2200  doxycycline (VIBRA-TABS) tablet 100 mg  Status:  Discontinued     100 mg Oral Every 12 hours 01/17/14 2149 01/18/14 1357   01/17/14 2200  levofloxacin (LEVAQUIN) IVPB 500 mg  Status:  Discontinued     500 mg100 mL/hr over 60 Minutes Intravenous Every 24 hours 01/17/14 2149 01/20/14 0746   01/17/14 1845  levofloxacin (LEVAQUIN) IVPB 500 mg  Status:  Discontinued     500 mg100 mL/hr over 60 Minutes Intravenous  Once 01/17/14 1842 01/17/14 2149      Medications: Scheduled Meds: . cefTRIAXone (ROCEPHIN)  IV  2 g Intravenous Q24H  . hydrocerin   Topical TID  . Influenza vac split quadrivalent PF  0.5 mL Intramuscular Tomorrow-1000  . insulin aspart  0-20 Units Subcutaneous TID WC  . insulin aspart  0-5 Units Subcutaneous QHS  . insulin detemir  15 Units Subcutaneous q morning - 10a  .  lip balm  1 application Topical BID  . naproxen sodium  550 mg Oral BID WC  . pantoprazole  40 mg Oral BID AC  . polyethylene glycol  17 g Oral BID  . sodium chloride  3 mL Intravenous Q12H  . valACYclovir  1,000 mg Oral BID   Continuous Infusions:  PRN Meds:.sodium chloride, acetaminophen, acetaminophen, alum & mag hydroxide-simeth, bisacodyl, guaiFENesin, HYDROmorphone, magic mouthwash, menthol-cetylpyridinium, methocarbamol (ROBAXIN)  IV, methocarbamol, ondansetron **OR** ondansetron (ZOFRAN) IV, phenol, simethicone, sodium chloride    Objective: Weight change: -1 lb 8 oz (-0.68 kg)  Intake/Output Summary (Last 24 hours) at 01/24/14 1641 Last data filed at 01/24/14 0905  Gross per 24 hour  Intake   1000 ml  Output   1350 ml  Net   -350 ml   Blood pressure 114/70, pulse 85, temperature 99.2 F (37.3 C), temperature source Oral, resp. rate  28, height 5\' 1"  (1.549 m), weight 269 lb 12 oz (122.358 kg), last menstrual period 12/31/2013, SpO2 98 %. Temp:  [98.1 F (36.7 C)-99.2 F (37.3 C)] 99.2 F (37.3 C) (12/28 1400) Pulse Rate:  [85-94] 85 (12/28 1400) Resp:  [16-28] 28 (12/28 1400) BP: (96-134)/(55-72) 114/70 mmHg (12/28 1400) SpO2:  [97 %-99 %] 98 % (12/28 1400) Weight:  [269 lb 12 oz (122.358 kg)] 269 lb 12 oz (122.358 kg) (12/28 0528)  Physical Exam: General: Alert and awake, oriented x3, not in any acute distress. HEENT: anicteric sclera, pupils reactive to light and accommodation,   Nose improved today  This is yesterday's picture 01/23/14:      CVS regular rate, normal Chest:  no wheezing, rales or rhonchi Abdomen: soft tender to palpation greatest in RUQ Extremities: no  clubbing or edema noted bilaterally  Skin: no rashes  Neuro: nonfocal  CBC:  CBC Latest Ref Rng 01/24/2014 01/23/2014 01/22/2014  WBC 4.0 - 10.5 K/uL 8.8 10.0 11.0(H)  Hemoglobin 12.0 - 15.0 g/dL 9.6(E) 8.9(L) 9.3(L)  Hematocrit 36.0 - 46.0 % 26.0(L) 27.2(L) 28.7(L)  Platelets 150 - 400 K/uL 304 302 236     BMET  Recent Labs  01/22/14 0527  NA 137  K 4.0  CL 100  CO2 28  GLUCOSE 232*  BUN 11  CREATININE 0.81  CALCIUM 8.2*     Liver Panel   Recent Labs  01/22/14 0527  PROT 6.3  ALBUMIN 2.4*  AST 34  ALT 22  ALKPHOS 77  BILITOT 0.9       Sedimentation Rate No results for input(s): ESRSEDRATE in the last 72 hours. C-Reactive Protein No results for input(s): CRP in the last 72 hours.  Micro Results: Recent Results (from the past 720 hour(s))  GC/Chlamydia Probe Amp     Status: None   Collection Time: 01/13/14 10:50 AM  Result Value Ref Range Status   CT Probe RNA NEGATIVE NEGATIVE Final   GC Probe RNA NEGATIVE NEGATIVE Final    Comment: (NOTE)                                                                                       **Normal Reference Range: Negative**      Assay performed using  the Gen-Probe APTIMA COMBO2 (R) Assay. Acceptable specimen types for this assay include APTIMA Swabs (Unisex, endocervical, urethral, or vaginal), first void urine, and ThinPrep liquid based cytology samples. Performed at [facility]   Urine culture     Status: None   Collection Time: 01/17/14  5:17 PM  Result Value Ref Range Status   Specimen Description URINE, CLEAN CATCH  Final   Special Requests NONE  Final   Culture  Setup Time   Final    01/18/2014 04:24 Performed at [facility]    Colony Count   Final    80,000 COLONIES/ML Performed at [facility]    Culture   Final    STREPTOCOCCUS GROUP D;high probability for S.bovis Performed at [facility]    Report Status 01/19/2014 FINAL  Final   Organism ID, Bacteria STREPTOCOCCUS GROUP D;high probability for S.bovis  Final      Susceptibility   Streptococcus group d;high probability for s.bovis - MIC (ETEST)*    PENICILLIN <=0.125 SENSITIVE Sensitive     * STREPTOCOCCUS GROUP D;high probability for S.bovis  Culture, blood (routine x 2)     Status: None (Preliminary result)   Collection Time: 01/20/14  8:20 AM  Result Value Ref Range Status   Specimen Description BLOOD RIGHT ARM  Final   Special Requests BOTTLES DRAWN AEROBIC ONLY 5CC EACH BOTTLE  Final   Culture   Final           BLOOD CULTURE RECEIVED NO GROWTH TO DATE CULTURE WILL BE HELD FOR 5 DAYS BEFORE ISSUING A FINAL NEGATIVE REPORT Performed at [facility]    Report Status PENDING  Incomplete    Studies/Results: No results found.    Assessment/Plan:  Active Problems:   Morbid obesity   Diabetes mellitus type 2, uncontrolled   Endometritis   Abdominal pain - right side   FUO (fever of unknown origin)   Streptococcus bovis infection   Epigastric abdominal pain    Nicole Cherry is a 33 y.o. female with  Recent endometrial ablation forheavy menses who underwent endometrium ablation on January 01, 2012  withhysteroscopy confirmed that there was no perforation. Her post-procedure complication has mostly been abdominal discomfort leading to numerous medical visits. She had transvaginal ultrasound on 12/17 and no masses or abscesses were appreciated. The patient received an empiric course of ciprofloxacin, and doxycycline. She was admitted on 12/21 due to increasing abdominal pain localizing now to RUQ nad RLQ pain and leukocytosis of 13K with tachycardia. Her SIRS presentation led [facility]. Infectious work up showed ua some pyuria, ua showed 80,000 strep bovis, gc/chlamydia is negative. abd CT on admit did now show any abn to explain abdominal pain or leukocytosis. She was initiated on levofloxacin plus doxycycline for 3 days with no improvement in wbc. She had temp to 103 shortly after admission  which prompted drawing blood cx and change in antibiotics to ampicillin, gent and clindamycin.She was seen by Dr Drue SecondSnider who simplified to unasyn and doxy. She has had thorough evaluation for biliary pathology but has had normal imaging including HIDA scan. I have now changed her to rocephin alone and she has had defervescence of fevers.   #1 Fevers: My concerns is that she may have had an occult S bovis bacteremia (that was not diagnosed due to no blood cultures having been done until AFTER antibiotics already started)   While bacteremia with this pathogen has high association  with GI pathology and in partiular colonic pathology, colon cancer this patient has unremarkable CT imaging findings.   I did also find a case report of S bovis that occcurred in association with endometrial cancer and so I imagine it can also occur as complication of endometritis.   I am worried that in addition to occult bacteremia with S bovis that we only glimpsed via a urine culture that s  TTE did not reveal evidence of vegetations but I still remain concerned for this possibility. I was able to  find out that the patient DID have another urine culture earlier this month in which she grew Proteus. IF she were to have also grown S bovis then that might be more compelling evidence for this diagnosis  I had presented yesterday the patient (and husband and mother were present) with options of pursuing   A) TEE understanding there would be risk of esophageal perforation, intubation etc vs benefit of finding a diagnosis  B) Empiric IV therapy for endocarditis with IV rocephin for 4 weeks, benefit of IV antibiotics (over oral) and  of not having to have TEE done vs risks of PICC line etc  C) Try to go from current IV antibiotics to an oral regimen such as augmentin finish two week course of antibiotics and then check surveillance cultures  The patient discussed with family and wanted to pursue TEE  I discussed with Viann FishSpencer Tilley, yesterday and again with Dr. Elease HashimotoNahser today  I also discussed with my colleague Dr. Drue SecondSnider who thought this was the right thing to do.   Patient herself also still wants to go forward with TEE and Dr. Elease HashimotoNahser has arranged for this for tomorrow.  I spent greater than 45 minutes with the patient including greater than 50% of time in face to face counsel of the patient and in coordination of their care.    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 -- started valtrex 1 g bid and is improving  Dr. Drue SecondSnider is taking over the service tomorrow through January 1st.   LOS: 7 days   Acey LavCornelius Van Dam 01/24/2014, 4:41 PM

## 2014-01-25 ENCOUNTER — Encounter (HOSPITAL_COMMUNITY)
Admission: AD | Disposition: A | Discharge status: Home / Self Care | Payer: Self-pay | Source: Ambulatory Visit | Attending: Obstetrics and Gynecology

## 2014-01-25 ENCOUNTER — Encounter (HOSPITAL_COMMUNITY): Payer: Self-pay

## 2014-01-25 DIAGNOSIS — I361 Nonrheumatic tricuspid (valve) insufficiency: Secondary | ICD-10-CM

## 2014-01-25 HISTORY — PX: TEE WITHOUT CARDIOVERSION: SHX5443

## 2014-01-25 LAB — CBC WITH DIFFERENTIAL/PLATELET
Basophils Absolute: 0.1 10*3/uL (ref 0.0–0.1)
Basophils Relative: 1 % (ref 0–1)
EOS PCT: 1 % (ref 0–5)
Eosinophils Absolute: 0.1 10*3/uL (ref 0.0–0.7)
HCT: 28.1 % — ABNORMAL LOW (ref 36.0–46.0)
Hemoglobin: 9 g/dL — ABNORMAL LOW (ref 12.0–15.0)
Lymphocytes Relative: 24 % (ref 12–46)
Lymphs Abs: 2.5 10*3/uL (ref 0.7–4.0)
MCH: 25.7 pg — ABNORMAL LOW (ref 26.0–34.0)
MCHC: 32 g/dL (ref 30.0–36.0)
MCV: 80.3 fL (ref 78.0–100.0)
Monocytes Absolute: 0.7 10*3/uL (ref 0.1–1.0)
Monocytes Relative: 6 % (ref 3–12)
NEUTROS ABS: 7 10*3/uL (ref 1.7–7.7)
Neutrophils Relative %: 68 % (ref 43–77)
Platelets: 364 10*3/uL (ref 150–400)
RBC: 3.5 MIL/uL — ABNORMAL LOW (ref 3.87–5.11)
RDW: 16 % — AB (ref 11.5–15.5)
WBC: 10.4 10*3/uL (ref 4.0–10.5)

## 2014-01-25 LAB — GLUCOSE, CAPILLARY
Glucose-Capillary: 136 mg/dL — ABNORMAL HIGH (ref 70–99)
Glucose-Capillary: 117 mg/dL — ABNORMAL HIGH (ref 70–99)
Glucose-Capillary: 112 mg/dL — ABNORMAL HIGH (ref 70–99)
Glucose-Capillary: 87 mg/dL (ref 70–99)
Glucose-Capillary: 194 mg/dL — ABNORMAL HIGH (ref 70–99)

## 2014-01-25 SURGERY — ECHOCARDIOGRAM, TRANSESOPHAGEAL
Anesthesia: Moderate Sedation

## 2014-01-25 MED ORDER — AMOXICILLIN-POT CLAVULANATE 875-125 MG PO TABS
1.0000 | ORAL_TABLET | Freq: Two times a day (BID) | ORAL | Status: DC
  Administered 2014-01-25 – 2014-01-26 (×2): 1 via ORAL
  Filled 2014-01-25 (×4): qty 1

## 2014-01-25 MED ORDER — BUTAMBEN-TETRACAINE-BENZOCAINE 2-2-14 % EX AERO
INHALATION_SPRAY | CUTANEOUS | Status: DC | PRN
  Administered 2014-01-25: 2 via TOPICAL

## 2014-01-25 MED ORDER — MIDAZOLAM HCL 10 MG/2ML IJ SOLN
INTRAMUSCULAR | Status: DC | PRN
  Administered 2014-01-25 (×2): 2 mg via INTRAVENOUS
  Administered 2014-01-25: 1 mg via INTRAVENOUS

## 2014-01-25 MED ORDER — FENTANYL CITRATE 0.05 MG/ML IJ SOLN
INTRAMUSCULAR | Status: DC | PRN
  Administered 2014-01-25 (×3): 25 ug via INTRAVENOUS

## 2014-01-25 NOTE — Interval H&P Note (Signed)
History and Physical Interval Note:  01/25/2014 2:03 PM  Nicole Cherry  has presented today for surgery, with the diagnosis of ACCESS VALVES  The various methods of treatment have been discussed with the patient and family. After consideration of risks, benefits and other options for treatment, the patient has consented to  Procedure(s): TRANSESOPHAGEAL ECHOCARDIOGRAM (TEE) (N/A) as a surgical intervention .  The patient's history has been reviewed, patient examined, no change in status, stable for surgery.  I have reviewed the patient's chart and labs.  Questions were answered to the patient's satisfaction.     Harlea Goetzinger, Deloris PingPhilip J

## 2014-01-25 NOTE — Progress Notes (Signed)
Subjective: Patient reports no problems.  Denies pain currently and is happy with the news of a negative TEE.  Anticipating discharge.    Objective: I have reviewed patient's vital signs.  General: alert Resp: clear to auscultation bilaterally Cardio: regular rate and rhythm Abdomen: soft, NT Extremities: no calf tenderness   Assessment/Plan: Pt currently doing well Afebrile greater than 48hrs Preliminary report of TEE is negative Per ID note, will change antibiotics to oral with augmentin x 2wks If afebrile tomorrow, will plan to d/c home Rec f/u in office with Dr. Stefano GaulStringer in 2wks with [facility] reviewed Plan to d/c on ibuprofen if continues without pain   LOS: 8 days    Nicole Cherry Y 01/25/2014, 6:22 PM

## 2014-01-25 NOTE — Progress Notes (Signed)
  Echocardiogram Echocardiogram Transesophageal has been performed.  Arvil ChacoFoster, Kairos Panetta 01/25/2014, 2:23 PM

## 2014-01-25 NOTE — CV Procedure (Signed)
    Transesophageal Echocardiogram Note  Zachery DauerKia J Sidman [id number] September 04, 1980  Procedure: Transesophageal Echocardiogram Indications: bacteremia   Procedure Details Consent: Obtained Time Out: Verified patient identification, verified procedure, site/side was marked, verified correct patient position, special equipment/implants available, Radiology Safety Procedures followed,  medications/allergies/relevent history reviewed, required imaging and test results available.  Performed  Medications: Fentanyl: 75 mcg iv Versed: 5 mg iv   Left Ventrical:  Normal LV   Mitral Valve: normal , no vegetations  Aortic Valve: normal , no vegetations   Tricuspid Valve: normal valve, no vegetation.  Mild TR  Pulmonic Valve: normal   Left Atrium/ Left atrial appendage: large, normal, no thrombi  Atrial septum: no ASD or PFO by color flow  Aorta: normal    Complications: No apparent complications Patient did tolerate procedure well.   Vesta MixerPhilip J. Juvencio Verdi, Montez HagemanJr., MD, [facility] 01/25/2014, 2:19 PM

## 2014-01-25 NOTE — H&P (View-Only) (Signed)
[facility]     Day #3 rocephin  3 days of  unasyn 3 days of inpatient  doxy but also 10 days of outpatient doxy 2 days of inpatient levaquin  Subjective: Still c/o RUQ pain, orthopenea frustrated that no clear cut diagnosis has been made  She also has sore under her nose and within nose adn inside of mouth that are improved after starting valtrex  Antibiotics:  Anti-infectives    Start     Dose/Rate Route Frequency Ordered Stop   01/23/14 1800  valACYclovir (VALTREX) tablet 1,000 mg     1,000 mg Oral 2 times daily 01/23/14 1727     01/23/14 1727  valACYclovir (VALTREX) tablet 500 mg  Status:  Discontinued     500 mg Oral 2 times daily 01/23/14 1727 01/23/14 1727   01/22/14 1630  cefTRIAXone (ROCEPHIN) 2 g in dextrose 5 % 50 mL IVPB - Premix     2 g100 mL/hr over 30 Minutes Intravenous Every 24 hours 01/22/14 1607     01/21/14 1600  doxycycline (VIBRAMYCIN) 100 mg in dextrose 5 % 250 mL IVPB  Status:  Discontinued     100 mg125 mL/hr over 120 Minutes Intravenous Every 12 hours 01/21/14 1515 01/22/14 1607   01/20/14 1600  doxycycline (VIBRA-TABS) tablet 100 mg  Status:  Discontinued     100 mg Oral Every 12 hours 01/20/14 1501 01/21/14 1515   01/20/14 1530  Ampicillin-Sulbactam (UNASYN) 3 g in sodium chloride 0.9 % 100 mL IVPB  Status:  Discontinued     3 g100 mL/hr over 60 Minutes Intravenous Every 6 hours 01/20/14 1501 01/22/14 1607   01/20/14 0900  gentamicin (GARAMYCIN) 150 mg, clindamycin (CLEOCIN) 900 mg in dextrose 5 % 100 mL IVPB  Status:  Discontinued     219.5 mL/hr over 30 Minutes Intravenous Every 8 hours 01/20/14 0756 01/20/14 1501   01/20/14 0800  ampicillin (OMNIPEN) 1 g in sodium chloride 0.9 % 50 mL IVPB  Status:  Discontinued     1 g150 mL/hr over 20 Minutes Intravenous 4 times per day 01/20/14 0746 01/20/14 1501   01/20/14 0800  clindamycin (CLEOCIN) IVPB 900 mg  Status:  Discontinued     900 mg100 mL/hr over 30 Minutes Intravenous  3 times per day 01/20/14 0746 01/20/14 0756   01/20/14 0800  gentamicin (GARAMYCIN) 240 mg in dextrose 5 % 50 mL IVPB  Status:  Discontinued     2 mg/kg  119.3 kg112 mL/hr over 30 Minutes Intravenous Every 8 hours 01/20/14 0746 01/20/14 0757   01/17/14 2200  doxycycline (VIBRA-TABS) tablet 100 mg  Status:  Discontinued     100 mg Oral Every 12 hours 01/17/14 2149 01/18/14 1357   01/17/14 2200  levofloxacin (LEVAQUIN) IVPB 500 mg  Status:  Discontinued     500 mg100 mL/hr over 60 Minutes Intravenous Every 24 hours 01/17/14 2149 01/20/14 0746   01/17/14 1845  levofloxacin (LEVAQUIN) IVPB 500 mg  Status:  Discontinued     500 mg100 mL/hr over 60 Minutes Intravenous  Once 01/17/14 1842 01/17/14 2149      Medications: Scheduled Meds: . cefTRIAXone (ROCEPHIN)  IV  2 g Intravenous Q24H  . hydrocerin   Topical TID  . Influenza vac split quadrivalent PF  0.5 mL Intramuscular Tomorrow-1000  . insulin aspart  0-20 Units Subcutaneous TID WC  . insulin aspart  0-5 Units Subcutaneous QHS  . insulin detemir  15 Units Subcutaneous q morning - 10a  .  lip balm  1 application Topical BID  . naproxen sodium  550 mg Oral BID WC  . pantoprazole  40 mg Oral BID AC  . polyethylene glycol  17 g Oral BID  . sodium chloride  3 mL Intravenous Q12H  . valACYclovir  1,000 mg Oral BID   Continuous Infusions:  PRN Meds:.sodium chloride, acetaminophen, acetaminophen, alum & mag hydroxide-simeth, bisacodyl, guaiFENesin, HYDROmorphone, magic mouthwash, menthol-cetylpyridinium, methocarbamol (ROBAXIN)  IV, methocarbamol, ondansetron **OR** ondansetron (ZOFRAN) IV, phenol, simethicone, sodium chloride    Objective: Weight change: -1 lb 8 oz (-0.68 kg)  Intake/Output Summary (Last 24 hours) at 01/24/14 1641 Last data filed at 01/24/14 0905  Gross per 24 hour  Intake   1000 ml  Output   1350 ml  Net   -350 ml   Blood pressure 114/70, pulse 85, temperature 99.2 F (37.3 C), temperature source Oral, resp. rate  28, height 5\' 1"  (1.549 m), weight 269 lb 12 oz (122.358 kg), last menstrual period 12/31/2013, SpO2 98 %. Temp:  [98.1 F (36.7 C)-99.2 F (37.3 C)] 99.2 F (37.3 C) (12/28 1400) Pulse Rate:  [85-94] 85 (12/28 1400) Resp:  [16-28] 28 (12/28 1400) BP: (96-134)/(55-72) 114/70 mmHg (12/28 1400) SpO2:  [97 %-99 %] 98 % (12/28 1400) Weight:  [269 lb 12 oz (122.358 kg)] 269 lb 12 oz (122.358 kg) (12/28 0528)  Physical Exam: General: Alert and awake, oriented x3, not in any acute distress. HEENT: anicteric sclera, pupils reactive to light and accommodation,   Nose improved today  This is yesterday's picture 01/23/14:      CVS regular rate, normal Chest:  no wheezing, rales or rhonchi Abdomen: soft tender to palpation greatest in RUQ Extremities: no  clubbing or edema noted bilaterally  Skin: no rashes  Neuro: nonfocal  CBC:  CBC Latest Ref Rng 01/24/2014 01/23/2014 01/22/2014  WBC 4.0 - 10.5 K/uL 8.8 10.0 11.0(H)  Hemoglobin 12.0 - 15.0 g/dL 9.6(E) 8.9(L) 9.3(L)  Hematocrit 36.0 - 46.0 % 26.0(L) 27.2(L) 28.7(L)  Platelets 150 - 400 K/uL 304 302 236     BMET  Recent Labs  01/22/14 0527  NA 137  K 4.0  CL 100  CO2 28  GLUCOSE 232*  BUN 11  CREATININE 0.81  CALCIUM 8.2*     Liver Panel   Recent Labs  01/22/14 0527  PROT 6.3  ALBUMIN 2.4*  AST 34  ALT 22  ALKPHOS 77  BILITOT 0.9       Sedimentation Rate No results for input(s): ESRSEDRATE in the last 72 hours. C-Reactive Protein No results for input(s): CRP in the last 72 hours.  Micro Results: Recent Results (from the past 720 hour(s))  GC/Chlamydia Probe Amp     Status: None   Collection Time: 01/13/14 10:50 AM  Result Value Ref Range Status   CT Probe RNA NEGATIVE NEGATIVE Final   GC Probe RNA NEGATIVE NEGATIVE Final    Comment: (NOTE)                                                                                       **Normal Reference Range: Negative**      Assay performed using  the Gen-Probe APTIMA COMBO2 (R) Assay. Acceptable specimen types for this assay include APTIMA Swabs (Unisex, endocervical, urethral, or vaginal), first void urine, and ThinPrep liquid based cytology samples. Performed at [facility]   Urine culture     Status: None   Collection Time: 01/17/14  5:17 PM  Result Value Ref Range Status   Specimen Description URINE, CLEAN CATCH  Final   Special Requests NONE  Final   Culture  Setup Time   Final    01/18/2014 04:24 Performed at [facility]    Colony Count   Final    80,000 COLONIES/ML Performed at [facility]    Culture   Final    STREPTOCOCCUS GROUP D;high probability for S.bovis Performed at [facility]    Report Status 01/19/2014 FINAL  Final   Organism ID, Bacteria STREPTOCOCCUS GROUP D;high probability for S.bovis  Final      Susceptibility   Streptococcus group d;high probability for s.bovis - MIC (ETEST)*    PENICILLIN <=0.125 SENSITIVE Sensitive     * STREPTOCOCCUS GROUP D;high probability for S.bovis  Culture, blood (routine x 2)     Status: None (Preliminary result)   Collection Time: 01/20/14  8:20 AM  Result Value Ref Range Status   Specimen Description BLOOD RIGHT ARM  Final   Special Requests BOTTLES DRAWN AEROBIC ONLY 5CC EACH BOTTLE  Final   Culture   Final           BLOOD CULTURE RECEIVED NO GROWTH TO DATE CULTURE WILL BE HELD FOR 5 DAYS BEFORE ISSUING A FINAL NEGATIVE REPORT Performed at [facility]    Report Status PENDING  Incomplete    Studies/Results: No results found.    Assessment/Plan:  Active Problems:   Morbid obesity   Diabetes mellitus type 2, uncontrolled   Endometritis   Abdominal pain - right side   FUO (fever of unknown origin)   Streptococcus bovis infection   Epigastric abdominal pain    Nicole Cherry is a 33 y.o. female with  Recent endometrial ablation forheavy menses who underwent endometrium ablation on January 01, 2012  withhysteroscopy confirmed that there was no perforation. Her post-procedure complication has mostly been abdominal discomfort leading to numerous medical visits. She had transvaginal ultrasound on 12/17 and no masses or abscesses were appreciated. The patient received an empiric course of ciprofloxacin, and doxycycline. She was admitted on 12/21 due to increasing abdominal pain localizing now to RUQ nad RLQ pain and leukocytosis of 13K with tachycardia. Her SIRS presentation led [facility]. Infectious work up showed ua some pyuria, ua showed 80,000 strep bovis, gc/chlamydia is negative. abd CT on admit did now show any abn to explain abdominal pain or leukocytosis. She was initiated on levofloxacin plus doxycycline for 3 days with no improvement in wbc. She had temp to 103 shortly after admission  which prompted drawing blood cx and change in antibiotics to ampicillin, gent and clindamycin.She was seen by Dr Drue SecondSnider who simplified to unasyn and doxy. She has had thorough evaluation for biliary pathology but has had normal imaging including HIDA scan. I have now changed her to rocephin alone and she has had defervescence of fevers.   #1 Fevers: My concerns is that she may have had an occult S bovis bacteremia (that was not diagnosed due to no blood cultures having been done until AFTER antibiotics already started)   While bacteremia with this pathogen has high association  with GI pathology and in partiular colonic pathology, colon cancer this patient has unremarkable CT imaging findings.   I did also find a case report of S bovis that occcurred in association with endometrial cancer and so I imagine it can also occur as complication of endometritis.   I am worried that in addition to occult bacteremia with S bovis that we only glimpsed via a urine culture that s  TTE did not reveal evidence of vegetations but I still remain concerned for this possibility. I was able to  find out that the patient DID have another urine culture earlier this month in which she grew Proteus. IF she were to have also grown S bovis then that might be more compelling evidence for this diagnosis  I had presented yesterday the patient (and husband and mother were present) with options of pursuing   A) TEE understanding there would be risk of esophageal perforation, intubation etc vs benefit of finding a diagnosis  B) Empiric IV therapy for endocarditis with IV rocephin for 4 weeks, benefit of IV antibiotics (over oral) and  of not having to have TEE done vs risks of PICC line etc  C) Try to go from current IV antibiotics to an oral regimen such as augmentin finish two week course of antibiotics and then check surveillance cultures  The patient discussed with family and wanted to pursue TEE  I discussed with Viann FishSpencer Tilley, yesterday and again with Dr. Elease HashimotoNahser today  I also discussed with my colleague Dr. Drue SecondSnider who thought this was the right thing to do.   Patient herself also still wants to go forward with TEE and Dr. Elease HashimotoNahser has arranged for this for tomorrow.  I spent greater than 45 minutes with the patient including greater than 50% of time in face to face counsel of the patient and in coordination of their care.    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 -- started valtrex 1 g bid and is improving  Dr. Drue SecondSnider is taking over the service tomorrow through January 1st.   LOS: 7 days   Acey LavCornelius Van Dam 01/24/2014, 4:41 PM

## 2014-01-26 ENCOUNTER — Encounter (HOSPITAL_COMMUNITY): Payer: Self-pay | Admitting: Cardiovascular Disease

## 2014-01-26 LAB — CBC WITH DIFFERENTIAL/PLATELET
BASOS PCT: 0 % (ref 0–1)
Basophils Absolute: 0 10*3/uL (ref 0.0–0.1)
EOS ABS: 0.1 10*3/uL (ref 0.0–0.7)
EOS PCT: 1 % (ref 0–5)
HEMATOCRIT: 28.5 % — AB (ref 36.0–46.0)
HEMOGLOBIN: 9 g/dL — AB (ref 12.0–15.0)
LYMPHS ABS: 2.1 10*3/uL (ref 0.7–4.0)
Lymphocytes Relative: 22 % (ref 12–46)
MCH: 25.4 pg — AB (ref 26.0–34.0)
MCHC: 31.6 g/dL (ref 30.0–36.0)
MCV: 80.3 fL (ref 78.0–100.0)
MONO ABS: 0.5 10*3/uL (ref 0.1–1.0)
MONOS PCT: 5 % (ref 3–12)
Neutro Abs: 7.1 10*3/uL (ref 1.7–7.7)
Neutrophils Relative %: 72 % (ref 43–77)
Platelets: 403 10*3/uL — ABNORMAL HIGH (ref 150–400)
RBC: 3.55 MIL/uL — AB (ref 3.87–5.11)
RDW: 15.8 % — ABNORMAL HIGH (ref 11.5–15.5)
WBC: 9.9 10*3/uL (ref 4.0–10.5)

## 2014-01-26 LAB — CULTURE, BLOOD (ROUTINE X 2): Culture: NO GROWTH

## 2014-01-26 LAB — GLUCOSE, CAPILLARY: Glucose-Capillary: 137 mg/dL — ABNORMAL HIGH (ref 70–99)

## 2014-01-26 MED ORDER — METHOCARBAMOL 500 MG PO TABS: 1000.0000 mg | ORAL_TABLET | Freq: Four times a day (QID) | ORAL | Status: DC | PRN

## 2014-01-26 MED ORDER — INSULIN DETEMIR 100 UNIT/ML ~~LOC~~ SOLN: 15.0000 [IU] | Freq: Every morning | SUBCUTANEOUS | Status: DC

## 2014-01-26 MED ORDER — AMOXICILLIN-POT CLAVULANATE 875-125 MG PO TABS: 1.0000 | ORAL_TABLET | Freq: Two times a day (BID) | ORAL | Status: DC

## 2014-01-26 MED ORDER — INSULIN ASPART 100 UNIT/ML ~~LOC~~ SOLN: 0.0000 [IU] | Freq: Three times a day (TID) | SUBCUTANEOUS | Status: DC

## 2014-01-26 MED ORDER — VALACYCLOVIR HCL 1 G PO TABS: 1000.0000 mg | ORAL_TABLET | Freq: Two times a day (BID) | ORAL | Status: DC

## 2014-01-26 MED ORDER — INSULIN ASPART 100 UNIT/ML ~~LOC~~ SOLN: 0.0000 [IU] | Freq: Every day | SUBCUTANEOUS | Status: AC

## 2014-01-26 NOTE — Discharge Summary (Signed)
Physician Discharge Summary  Patient ID: Nicole Cherry MRN: [id number] DOB/AGE: 01-May-1980 33 y.o.   Admit date: 01/17/2014 Discharge date: 01/26/2014  Admission Diagnoses:  Morbid obesity   Diabetes mellitus type 2, uncontrolled   Endometritis   Abdominal pain - lower abdomen bilaterally and right upper abdomen.     FUO (fever of unknown origin) History of endometrial ablation via Novasure on 12/31/13.  Constipation.  Discharge Diagnoses:  Active Problems:   Morbid obesity   Diabetes mellitus type 2, uncontrolled   Endometritis   Abdominal pain - right side   FUO (fever of unknown origin)   Streptococcus bovis infection   Epigastric abdominal pain History of endometrial abltion via Novasure on 12/31/13.  Nasal herpes simplex. Constipation. Musculoskeletal pain.  Discharged Condition: stable  Hospital Course: Patient was admitted with complaints of bilateral lower and right upper abdominal pain.  She had fever and chills at the hospital.  She had an extensive fever workup that was negative except for Strep bovis in her urine culture and an elevated WBC that subsequently normalized.  She received antibiotics as per ID until she became afebrile for over 48 hrs. She had internal medicine consult for her uncontrolled diabetes and she was started on insulin.  She had a nasal herpes outbreak that was treated with valtrex.  Infectious disease was concerned for Strep bovis endocarditis but her work up was negative by TEE and TTE.  However they recommended patient continue with antibiotic for the next two weeks. Her constipation improved with miralax use.  Surgical consult was also obtained and he suggested pain in right upper abdomen could be muskuloskeletal pain in origin and muscle relaxant was ordered.  On day of discharge patient reported feeling better without any more abdominal pain, fevers or chills. She was deemed stable for discharge.          Consults: cardiology, ID, general  surgery and Internal medicine  Significant Diagnostic Studies: As above  Treatments: As mentioned above.    Discharge Exam: Blood pressure 121/71, pulse 81, temperature 98.9 F (37.2 C), temperature source Oral, resp. rate 18, height 5\' 1"  (1.549 m), weight 263 lb (119.296 kg), last menstrual period 12/31/2013, SpO2 100 %. General appearance: alert and no distress Nose: Nares normal. Septum midline. Mucosa normal. No drainage or sinus tenderness., few small healing blisters at base of nostrils bilaterally. Resp: clear to auscultation bilaterally Chest wall: no tenderness Cardio: regular rate and rhythm, S1, S2 normal, no murmur, click, rub or gallop GI: soft, non-tender; bowel sounds normal; no masses,  no organomegaly Extremities: no edema, redness or tenderness in the calves or thighs  Disposition: 01-Home or Self Care     Medication List    STOP taking these medications        ciprofloxacin 250 MG tablet  Commonly known as:  CIPRO     doxycycline 100 MG tablet  Commonly known as:  VIBRA-TABS     metFORMIN 500 MG tablet  Commonly known as:  GLUCOPHAGE     naproxen sodium 220 MG tablet  Commonly known as:  ANAPROX     oxyCODONE-acetaminophen 5-325 MG per tablet  Commonly known as:  ROXICET      TAKE these medications        amoxicillin-clavulanate 875-125 MG per tablet  Commonly known as:  AUGMENTIN  Take 1 tablet by mouth every 12 (twelve) hours.     ibuprofen 600 MG tablet  Commonly known as:  ADVIL,MOTRIN  Take 600 mg by mouth  every 6 (six) hours as needed for moderate pain.     insulin aspart 100 UNIT/ML injection  Commonly known as:  novoLOG  Inject 0-20 Units into the skin 3 (three) times daily with meals.     insulin aspart 100 UNIT/ML injection  Commonly known as:  novoLOG  Inject 0-5 Units into the skin at bedtime.     insulin detemir 100 UNIT/ML injection  Commonly known as:  LEVEMIR  Inject 0.15 mLs (15 Units total) into the skin every morning.      meclizine 50 MG tablet  Commonly known as:  ANTIVERT  Take 1 tablet (50 mg total) by mouth 3 (three) times daily as needed. For dizziness     methocarbamol 500 MG tablet  Commonly known as:  ROBAXIN  Take 2 tablets (1,000 mg total) by mouth every 6 (six) hours as needed for muscle spasms.     valACYclovir 1000 MG tablet  Commonly known as:  VALTREX  Take 1 tablet (1,000 mg total) by mouth 2 (two) times daily.       Follow-up Information    [facility].   Specialty:  Obstetrics and Gynecology   Contact information:   [address] [place] [address] [phone]      Patient was also advised to follow up with her PCP for diabetes management, she has an appointment on Jan 9th 2016.  Patient was given a sliding scale for her novolog use.   SignedKonrad Felix: Ianna Salmela WAKURU 01/26/2014, 9:42 PM

## 2014-01-26 NOTE — Progress Notes (Signed)
Pt is discharged in the care of Son.,with N.T. Escort. Denies any pain or discomfort. Spirits are good. Also denies vaginal bleeding.Pt understands all discharge instructions well Questions were asked and answered. Stable . Diabetic teaching continues as ordered, good understanding

## 2014-01-26 NOTE — Progress Notes (Signed)
CBG at 2200 is 194. No insulin needed until 204

## 2014-07-21 ENCOUNTER — Encounter (INDEPENDENT_AMBULATORY_CARE_PROVIDER_SITE_OTHER): Payer: Self-pay

## 2015-01-15 ENCOUNTER — Emergency Department (INDEPENDENT_AMBULATORY_CARE_PROVIDER_SITE_OTHER)
Admission: EM | Admit: 2015-01-15 | Discharge: 2015-01-15 | Disposition: A | Discharge status: Home / Self Care | Payer: Self-pay | Source: Home / Self Care

## 2015-01-15 ENCOUNTER — Encounter (HOSPITAL_COMMUNITY): Payer: Self-pay | Admitting: Emergency Medicine

## 2015-01-15 DIAGNOSIS — H65192 Other acute nonsuppurative otitis media, left ear: Secondary | ICD-10-CM

## 2015-01-15 MED ORDER — AMOXICILLIN 500 MG PO CAPS: 500.0000 mg | ORAL_CAPSULE | Freq: Three times a day (TID) | ORAL | Status: DC

## 2015-01-15 MED ORDER — KETOROLAC TROMETHAMINE 30 MG/ML IJ SOLN
INTRAMUSCULAR | Status: AC
  Filled 2015-01-15: qty 1

## 2015-01-15 NOTE — ED Notes (Signed)
Here with c/u flu like sx's that started yesterday Tried otc Zyrtec and Tylenol without relief

## 2015-01-15 NOTE — Discharge Instructions (Signed)
Otitis Media, Adult °Otitis media is redness, soreness, and puffiness (swelling) in the space just behind your eardrum (middle ear). It may be caused by allergies or infection. It often happens along with a cold. °HOME CARE °· Take your medicine as told. Finish it even if you start to feel better. °· Only take over-the-counter or prescription medicines for pain, discomfort, or fever as told by your doctor. °· Follow up with your doctor as told. °GET HELP IF: °· You have otitis media only in one ear, or bleeding from your nose, or both. °· You notice a lump on your neck. °· You are not getting better in 3-5 days. °· You feel worse instead of better. °GET HELP RIGHT AWAY IF:  °· You have pain that is not helped with medicine. °· You have puffiness, redness, or pain around your ear. °· You get a stiff neck. °· You cannot move part of your face (paralysis). °· You notice that the bone behind your ear hurts when you touch it. °MAKE SURE YOU:  °· Understand these instructions. °· Will watch your condition. °· Will get help right away if you are not doing well or get worse. °  °This information is not intended to replace advice given to you by your health care provider. Make sure you discuss any questions you have with your health care provider. °  °Document Released: 07/03/2007 Document Revised: 02/04/2014 Document Reviewed: 08/11/2012 °Elsevier Interactive Patient Education ©2016 [employer] ° ° °

## 2015-01-15 NOTE — ED Provider Notes (Signed)
CSN: [id number]     Arrival date & time 01/15/15  1729 History   None    No chief complaint on file.  (Consider location/radiation/quality/duration/timing/severity/associated sxs/prior Treatment) HPI History obtained from patient:   LOCATION:upper resp SEVERITY: DURATION:yesterday CONTEXT:sudden onset QUALITY: MODIFYING FACTORS: OTC meds without relief ASSOCIATED SYMPTOMS: runny nose, cough TIMING:constant OCCUPATION: day care worker  Past Medical History  Diagnosis Date  . Diabetes   . H/O: cesarean section x 2 01/13/2014   Past Surgical History  Procedure Laterality Date  . Tubal ligation    . Cesarean section    . Tee without cardioversion N/A 01/25/2014    Procedure: TRANSESOPHAGEAL ECHOCARDIOGRAM (TEE);  Surgeon: Vesta Mixer, MD;  Location: [facility] ENDOSCOPY;  Service: Cardiovascular;  Laterality: N/A;   No family history on file. Social History  Substance Use Topics  . Smoking status: Never Smoker   . Smokeless tobacco: Not on file  . Alcohol Use: No   OB History    Gravida Para Term Preterm AB TAB SAB Ectopic Multiple Living   Review of Systems ROS +'ve upper respiratory  Denies: HEADACHE, NAUSEA, ABDOMINAL PAIN, CHEST PAIN, CONGESTION, DYSURIA, SHORTNESS OF BREATH  Allergies  Review of patient's allergies indicates no known allergies.  Home Medications   Prior to Admission medications   Medication Sig Start Date End Date Taking? Authorizing Provider  amoxicillin-clavulanate (AUGMENTIN) 875-125 MG per tablet Take 1 tablet by mouth every 12 (twelve) hours. 01/26/14   Hoover Browns, MD  ibuprofen (ADVIL,MOTRIN) 600 MG tablet Take 600 mg by mouth every 6 (six) hours as needed for moderate pain.    Historical Provider, MD  insulin aspart (NOVOLOG) 100 UNIT/ML injection Inject 0-20 Units into the skin 3 (three) times daily with meals. 01/26/14   Hoover Browns, MD  insulin aspart (NOVOLOG) 100 UNIT/ML injection Inject 0-5 Units into the skin at  bedtime. 01/26/14   Hoover Browns, MD  insulin detemir (LEVEMIR) 100 UNIT/ML injection Inject 0.15 mLs (15 Units total) into the skin every morning. 01/26/14   Hoover Browns, MD  meclizine (ANTIVERT) 50 MG tablet Take 1 tablet (50 mg total) by mouth 3 (three) times daily as needed. For dizziness Patient not taking: Reported on 01/13/2014 07/20/13   Linna Hoff, MD  methocarbamol (ROBAXIN) 500 MG tablet Take 2 tablets (1,000 mg total) by mouth every 6 (six) hours as needed for muscle spasms. 01/26/14   Hoover Browns, MD  valACYclovir (VALTREX) 1000 MG tablet Take 1 tablet (1,000 mg total) by mouth 2 (two) times daily. 01/26/14   Hoover Browns, MD   Meds Ordered and Administered this Visit  Medications - No data to display  BP 136/80 mmHg  Pulse 88  Temp(Src) 98.7 F (37.1 C) (Oral)  Resp 20  SpO2 100% No data found.   Physical Exam  Constitutional: She is oriented to person, place, and time. She appears well-developed and well-nourished. No distress.  HENT:  Head: Normocephalic and atraumatic.  Right Ear: External ear normal. Tympanic membrane is erythematous and bulging. Tympanic membrane mobility is abnormal.  Left Ear: External ear normal.  Nose: Rhinorrhea present. Right sinus exhibits no maxillary sinus tenderness and no frontal sinus tenderness. Left sinus exhibits no maxillary sinus tenderness and no frontal sinus tenderness.  Mouth/Throat: Uvula is midline and oropharynx is clear and moist. No oropharyngeal exudate, posterior oropharyngeal erythema or tonsillar abscesses.  Eyes: Conjunctivae are normal.  Neck: Normal range of  motion. Neck supple.  Cardiovascular: Normal rate.   Pulmonary/Chest: Effort normal and breath sounds normal.  Abdominal: Soft.  Musculoskeletal: Normal range of motion.  Neurological: She is alert and oriented to person, place, and time.  Skin: Skin is dry.  Psychiatric: She has a normal mood and affect. Her behavior is normal. Judgment and thought content normal.   Nursing note and vitals reviewed.   ED Course  Procedures (including critical care time)  Labs Review Labs Reviewed - No data to display  Imaging Review No results found.   Visual Acuity Review  Right Eye Distance:   Left Eye Distance:   Bilateral Distance:    Right Eye Near:   Left Eye Near:    Bilateral Near:         MDM   1. Acute nonsuppurative otitis media of left ear     Left otitis media noted.  Patient is advised to continue home symptomatic treatment. Prescription for amoxil provided to the patient. Patient is advised to continue Tylenol ibuprofen and hot baths for her symptoms. Follow-up with her primary care provider for recheck of her otitis media and glucose. Patient is advised that if there are new or worsening symptoms or attend the emergency department, or contact primary care provider. Instructions of care provided discharged home in stable condition.  THIS NOTE WAS GENERATED USING A VOICE RECOGNITION SOFTWARE PROGRAM. ALL REASONABLE EFFORTS  WERE MADE TO PROOFREAD THIS DOCUMENT FOR ACCURACY.     Tharon AquasFrank C Larrisa Cravey, PA 01/15/15 [phone]

## 2018-05-22 ENCOUNTER — Other Ambulatory Visit: Payer: Self-pay

## 2018-05-22 ENCOUNTER — Emergency Department (HOSPITAL_COMMUNITY)
Admission: EM | Admit: 2018-05-22 | Discharge: 2018-05-23 | Disposition: A | Discharge status: Home / Self Care | Payer: HRSA Program | Attending: Emergency Medicine | Admitting: Emergency Medicine

## 2018-05-22 ENCOUNTER — Encounter (HOSPITAL_COMMUNITY): Payer: Self-pay | Admitting: Emergency Medicine

## 2018-05-22 ENCOUNTER — Emergency Department (HOSPITAL_COMMUNITY): Payer: HRSA Program

## 2018-05-22 DIAGNOSIS — E119 Type 2 diabetes mellitus without complications: Secondary | ICD-10-CM | POA: Diagnosis not present

## 2018-05-22 DIAGNOSIS — R51 Headache: Secondary | ICD-10-CM | POA: Diagnosis not present

## 2018-05-22 DIAGNOSIS — R519 Headache, unspecified: Secondary | ICD-10-CM

## 2018-05-22 DIAGNOSIS — R0602 Shortness of breath: Secondary | ICD-10-CM | POA: Diagnosis present

## 2018-05-22 MED ORDER — ACETAMINOPHEN 500 MG PO TABS
1000.0000 mg | ORAL_TABLET | Freq: Once | ORAL | Status: AC
  Administered 2018-05-23: 1000 mg via ORAL
  Filled 2018-05-22: qty 2

## 2018-05-22 MED ORDER — METOCLOPRAMIDE HCL 5 MG/ML IJ SOLN
10.0000 mg | Freq: Once | INTRAMUSCULAR | Status: AC
  Administered 2018-05-23: 10 mg via INTRAVENOUS
  Filled 2018-05-22: qty 2

## 2018-05-22 MED ORDER — ALBUTEROL SULFATE HFA 108 (90 BASE) MCG/ACT IN AERS
2.0000 | INHALATION_SPRAY | Freq: Once | RESPIRATORY_TRACT | Status: AC
  Administered 2018-05-23: 2 via RESPIRATORY_TRACT
  Filled 2018-05-22: qty 6.7

## 2018-05-22 MED ORDER — DIPHENHYDRAMINE HCL 50 MG/ML IJ SOLN
25.0000 mg | Freq: Once | INTRAMUSCULAR | Status: AC
  Administered 2018-05-23: 25 mg via INTRAVENOUS
  Filled 2018-05-22: qty 1

## 2018-05-22 MED ORDER — LACTATED RINGERS IV BOLUS
1000.0000 mL | Freq: Once | INTRAVENOUS | Status: AC
  Administered 2018-05-23: 1000 mL via INTRAVENOUS

## 2018-05-22 MED ORDER — SODIUM CHLORIDE 0.9 % IV SOLN
500.0000 mg | Freq: Once | INTRAVENOUS | Status: AC
  Administered 2018-05-23: 500 mg via INTRAVENOUS
  Filled 2018-05-22: qty 500

## 2018-05-22 NOTE — ED Provider Notes (Signed)
Emergency Department Provider Note   I have reviewed the triage vital signs and the nursing notes.   HISTORY  Chief Complaint Shortness of Breath and Fever   HPI Nicole Cherry is a 38 y.o. female with history of diabetes and hypertension presents the emergency department today with shortness of breath.  Patient states Nicole Cherry was diagnosed with coronavirus recently got her test result back earlier today and was told Nicole Cherry worsening symptoms to come the emergency room.  Nicole Cherry states ever since Monday Nicole Cherry started having the cough and shortness of breath is progressively worsened.  There is time Nicole Cherry is also had a fever, nausea and a headache as well.  No neck pain or rigidity.  No back pain or rigidity.  No urinary symptoms.  No vomiting but has had a couple episodes of diarrhea recently.  No rashes or abdominal pain.  No chest pain except for when Nicole Cherry coughs.  Nicole Cherry is a non-smoker.  No alcohol or drugs.  Unknown sick contact.   No other associated or modifying symptoms.    Past Medical History:  Diagnosis Date  . Diabetes (HCC)   . H/O: cesarean section x 2 01/13/2014    Patient Active Problem List   Diagnosis Date Noted  . Epigastric abdominal pain   . FUO (fever of unknown origin)   . Streptococcus bovis infection   . Abdominal pain - right side 01/21/2014  . Endometritis 01/17/2014  . Pain in joint, pelvic region and thigh 01/13/2014  . Morbid obesity (HCC) 01/13/2014  . Diabetes mellitus type 2, uncontrolled (HCC) 01/13/2014    Past Surgical History:  Procedure Laterality Date  . CESAREAN SECTION    . TEE WITHOUT CARDIOVERSION N/A 01/25/2014   Procedure: TRANSESOPHAGEAL ECHOCARDIOGRAM (TEE);  Surgeon: Vesta Mixer, MD;  Location: [facility] ENDOSCOPY;  Service: Cardiovascular;  Laterality: N/A;  . TUBAL LIGATION      Current Outpatient Rx  . Order #: [id number] Class: Historical Med  . Order #: [id number] Class: Historical Med  . Order #: [id number] Class: Historical Med  . Order  #: [id number] Class: Historical Med  . Order #: [id number] Class: Print  . Order #: [id number] Class: Normal    Allergies Patient has no known allergies.  History reviewed. No pertinent family history.  Social History Social History   Tobacco Use  . Smoking status: Never Smoker  . Smokeless tobacco: Never Used  Substance Use Topics  . Alcohol use: No  . Drug use: No    Review of Systems  All other systems negative except as documented in the HPI. All pertinent positives and negatives as reviewed in the HPI. ____________________________________________   PHYSICAL EXAM:  VITAL SIGNS: ED Triage Vitals [05/22/18 2256]  Enc Vitals Group     BP 140/77     Pulse Rate (!) 101     Resp 18     Temp (!) 102.5 F (39.2 C)     Temp Source Oral     SpO2 98 %     Weight 240 lb (108.9 kg)     Height  (1.549 m)     Head Circumference      Peak Flow      Pain Score 6     Pain Loc      Pain Edu?      Excl. in GC?     Constitutional: Alert and oriented. Well appearing and in no acute distress. Eyes: Conjunctivae are normal. PERRL. EOMI. Head: Atraumatic. Nose: mild rhinnorhea. Mouth/Throat: Mucous membranes are moist.  Oropharynx non-erythematous. Neck: No stridor.  No meningeal signs.   Cardiovascular: tachycardic rate, regular rhythm. Good peripheral circulation. Grossly normal heart sounds.   Respiratory: tachypneic respiratory effort.  No retractions. Lungs CTAB. Gastrointestinal: Soft and nontender. No distention.  Musculoskeletal: No lower extremity tenderness nor edema. No gross deformities of extremities. Neurologic:  Normal speech and language. No gross focal neurologic deficits are appreciated.  Skin:  Skin is warm, dry and intact. No rash noted.   ____________________________________________   LABS (all labs ordered are listed, but only abnormal results are displayed)  Labs Reviewed - No data to display ____________________________________________  EKG    EKG Interpretation  Date/Time:  Saturday May 23 2018 00:09:50 EDT Ventricular Rate:  88 PR Interval:    QRS Duration: 104 QT Interval:  352 QTC Calculation: 426 R Axis:   150 Text Interpretation:  Sinus rhythm Probable left atrial enlargement Anterior infarct, old Abnormal right axis deviation No acute changes Confirmed by Marily Memos [id number]) on 05/23/2018 12:39:18 AM       ____________________________________________  RADIOLOGY  Dg Chest Portable 1 View  Result Date: 05/22/2018 CLINICAL DATA:  Shortness of breath cough and fever COVID-19 positive test EXAM: PORTABLE CHEST 1 VIEW COMPARISON:  01/22/2014 FINDINGS: Surgical hardware in the cervical spine. No acute airspace disease or effusion. Normal heart size. No pneumothorax. IMPRESSION: No active disease. Electronically Signed   By: Jasmine Pang M.D.   On: 05/22/2018 23:52    ____________________________________________   PROCEDURES  Procedure(s) performed:   Procedures   ____________________________________________   INITIAL IMPRESSION / ASSESSMENT AND PLAN / ED COURSE  [name] was evaluated in Emergency Department on 05/23/2018 for the symptoms described in the history of present illness. Nicole Cherry was evaluated in the context of the global COVID-19 pandemic, which necessitated consideration that the patient might be at risk for infection with the SARS-CoV-2 virus that causes COVID-19. Institutional protocols and algorithms that pertain to the evaluation of patients at risk for COVID-19 are in a state of rapid change based on information released by regulatory bodies including the CDC and federal and state organizations. These policies and algorithms were followed during the patient's care in the ED.   Likely worsening viral syndrome consistent with coronavirus.  Nicole Cherry is on day 5 of illness Nicole Cherry is probably close to her worst but does not have any acute respiratory distress at this time.  I think the main reason why  Nicole Cherry is feeling bad is her fever that Nicole Cherry cannot get under control since Nicole Cherry can only really take Tylenol for it.  We will give her some fluids some Tylenol and a headache cocktail reevaluate.  We will get a chest x-ray and EKG for the chest pain shortness of breath.  Patient consistently appears without distress. Xray without severe ARDS appearance. RR normal. O2 slightly low when sleeping (around 92-93) but when awake and talking, consistently above 95.  Headache improved from previously but still present. Doubt meningitis. Suspect related to illness/fever, etc. Offered more HA medication, patient declined and is ready to go home. Told her there was a decent chance that this was going to get worse and if so Nicole Cherry needed to come back immediately for repeat evaluation. otherwise relax, hydrate, antipyretics with PRN PCP follow up.  Pertinent labs & imaging results that were available during my care of the patient were reviewed by me and considered in my medical decision making (see chart for details).  A medical screening exam was performed and I feel  the patient has had an appropriate workup for their chief complaint at this time and likelihood of emergent condition existing is low. They have been counseled on decision, discharge, follow up and which symptoms necessitate immediate return to the emergency department. They or their family verbally stated understanding and agreement with plan and discharged in stable condition.   ____________________________________________  FINAL CLINICAL IMPRESSION(S) / ED DIAGNOSES  Final diagnoses:  COVID-19 virus infection  Nonintractable headache, unspecified chronicity pattern, unspecified headache type     MEDICATIONS GIVEN DURING THIS VISIT:  Medications  acetaminophen (TYLENOL) tablet 1,000 mg (1,000 mg Oral Given 05/23/18 0012)  metoCLOPramide (REGLAN) injection 10 mg (10 mg Intravenous Given 05/23/18 0006)  lactated ringers bolus 1,000 mL (1,000 mLs  Intravenous New Bag/Given 05/23/18 0011)  diphenhydrAMINE (BENADRYL) injection 25 mg (25 mg Intravenous Given 05/23/18 0006)  albuterol (VENTOLIN HFA) 108 (90 Base) MCG/ACT inhaler 2 puff (2 puffs Inhalation Given 05/23/18 0013)  azithromycin (ZITHROMAX) 500 mg in sodium chloride 0.9 % 250 mL IVPB (500 mg Intravenous New Bag/Given 05/23/18 0016)     NEW OUTPATIENT MEDICATIONS STARTED DURING THIS VISIT:  New Prescriptions   AZITHROMYCIN (ZITHROMAX) 250 MG TABLET    Take 1 tablet (250 mg total) by mouth daily. Take 1 every day until finished.    Note:  This note was prepared with assistance of Dragon voice recognition software. Occasional wrong-word or sound-a-like substitutions may have occurred due to the inherent limitations of voice recognition software.   Odetta Forness, Barbara CowerJason, MD 05/23/18 [phone]

## 2018-05-22 NOTE — ED Triage Notes (Signed)
Patient tested positive for covid 19 yesterday at the doctors office. Patient states she is having sob, cough, and a fever. Patient states the doctor told her to come in if she has any more symptoms.

## 2018-05-23 MED ORDER — AZITHROMYCIN 250 MG PO TABS: 250.0000 mg | ORAL_TABLET | Freq: Every day | ORAL | 0 refills | Status: AC

## 2018-05-23 NOTE — ED Notes (Signed)
EKG given to EDP,Mesner,MD. For review.

## 2019-02-10 ENCOUNTER — Ambulatory Visit
Admission: RE | Admit: 2019-02-10 | Discharge: 2019-02-10 | Disposition: A | Discharge status: Home / Self Care | Payer: Medicaid Other | Source: Ambulatory Visit | Attending: Chiropractor | Admitting: Chiropractor

## 2019-02-10 ENCOUNTER — Other Ambulatory Visit: Payer: Self-pay | Admitting: Chiropractor

## 2019-02-10 DIAGNOSIS — M542 Cervicalgia: Secondary | ICD-10-CM
